# Patient Record
Sex: Female | Born: 1940 | Race: White | Hispanic: No | Marital: Married | Smoking: Never smoker
Health system: Southern US, Community
[De-identification: ages and names within clinical notes are randomized; demographics above are authoritative.]

---

## 2019-02-10 ENCOUNTER — Emergency Department (HOSPITAL_COMMUNITY): Payer: Medicare Other

## 2019-02-10 ENCOUNTER — Encounter (HOSPITAL_COMMUNITY): Payer: Self-pay | Admitting: Emergency Medicine

## 2019-02-10 ENCOUNTER — Emergency Department (HOSPITAL_COMMUNITY)
Admission: EM | Admit: 2019-02-10 | Discharge: 2019-02-11 | Disposition: A | Discharge status: Home / Self Care | Payer: Medicare Other | Attending: Emergency Medicine | Admitting: Emergency Medicine

## 2019-02-10 DIAGNOSIS — Y939 Activity, unspecified: Secondary | ICD-10-CM | POA: Diagnosis not present

## 2019-02-10 DIAGNOSIS — Y999 Unspecified external cause status: Secondary | ICD-10-CM | POA: Insufficient documentation

## 2019-02-10 DIAGNOSIS — F0390 Unspecified dementia without behavioral disturbance: Secondary | ICD-10-CM | POA: Diagnosis not present

## 2019-02-10 DIAGNOSIS — W19XXXA Unspecified fall, initial encounter: Secondary | ICD-10-CM | POA: Diagnosis not present

## 2019-02-10 DIAGNOSIS — Z79899 Other long term (current) drug therapy: Secondary | ICD-10-CM | POA: Insufficient documentation

## 2019-02-10 DIAGNOSIS — Y92129 Unspecified place in nursing home as the place of occurrence of the external cause: Secondary | ICD-10-CM | POA: Insufficient documentation

## 2019-02-10 DIAGNOSIS — S0990XA Unspecified injury of head, initial encounter: Secondary | ICD-10-CM | POA: Diagnosis present

## 2019-02-10 DIAGNOSIS — S0003XA Contusion of scalp, initial encounter: Secondary | ICD-10-CM | POA: Insufficient documentation

## 2019-02-10 NOTE — ED Triage Notes (Signed)
Patient is from [facility] and transported via [facility]. Patient stood up and fell backwards. A staff member was near the patient but didn;t the patient fall. Patient has a posterior hematoma. According to EMS, patient has had no LOC and not taking any blood thinner. EMS applied a c-collar to patient.

## 2019-02-10 NOTE — ED Provider Notes (Signed)
[facility] DEPT Provider Note   CSN: [id number] Arrival date & time: 02/10/19  2036    History   Chief Complaint Chief Complaint  Patient presents with  . Fall    HPI Claire Hoffman is a 78 y.o. female.     78 yo F with a chief complaints of a fall.  This happened at her facility.  They noted that she had a hematoma to the back of the head and sent her here.  At baseline per the nursing facility.  History of dementia.  Level 5 caveat dementia.  The history is provided by the patient, the EMS personnel and the nursing home.  Fall Pertinent negatives include no chest pain, no headaches and no shortness of breath.  Illness Severity:  Mild Onset quality:  Sudden Duration:  2 hours Timing:  Constant Progression:  Unchanged Chronicity:  New Associated symptoms: no chest pain, no congestion, no fever, no headaches, no myalgias, no nausea, no rhinorrhea, no shortness of breath, no vomiting and no wheezing     History reviewed. No pertinent past medical history.  There are no active problems to display for this patient.   History reviewed. No pertinent surgical history.   OB History   No obstetric history on file.      Home Medications    Prior to Admission medications   Medication Sig Start Date End Date Taking? Authorizing Provider  amLODipine (NORVASC) 5 MG tablet Take 5 mg by mouth daily.   Yes [provider]  aspirin 81 MG chewable tablet Chew 81 mg by mouth daily.   Yes [provider]  atorvastatin (LIPITOR) 10 MG tablet Take 10 mg by mouth daily.   Yes [provider]  divalproex (DEPAKOTE) 125 MG DR tablet Take 125 mg by mouth 2 (two) times daily.   Yes [provider]  levothyroxine (SYNTHROID) 75 MCG tablet Take 75 mcg by mouth once.   Yes [provider]  lisinopril (ZESTRIL) 40 MG tablet Take 40 mg by mouth daily.   Yes [provider]  OLANZapine (ZYPREXA) 5 MG  tablet Take 5 mg by mouth at bedtime.   Yes [provider]    Family History No family history on file.  Social History Social History   Tobacco Use  . Smoking status: Not on file  Substance Use Topics  . Alcohol use: Not on file  . Drug use: Not on file     Allergies   Patient has no known allergies.   Review of Systems Review of Systems  Unable to perform ROS: Dementia  Constitutional: Negative for chills and fever.  HENT: Negative for congestion and rhinorrhea.   Eyes: Negative for redness and visual disturbance.  Respiratory: Negative for shortness of breath and wheezing.   Cardiovascular: Negative for chest pain and palpitations.  Gastrointestinal: Negative for nausea and vomiting.  Genitourinary: Negative for dysuria and urgency.  Musculoskeletal: Negative for arthralgias and myalgias.  Skin: Negative for pallor and wound.  Neurological: Negative for dizziness and headaches.     Physical Exam Updated Vital Signs BP (!) 155/81 (BP Location: Left Arm)   Pulse 90   Temp 98.5 F (36.9 C) (Oral)   Resp 15   SpO2 100%   Physical Exam Vitals signs and nursing note reviewed.  Constitutional:      General: She is not in acute distress.    Appearance: She is well-developed. She is not diaphoretic.     Comments: Moans to  stimulation  HENT:     Head: Normocephalic and atraumatic.  Eyes:     Pupils: Pupils are equal, round, and reactive to light.  Neck:     Musculoskeletal: Normal range of motion and neck supple.  Cardiovascular:     Rate and Rhythm: Normal rate and regular rhythm.     Heart sounds: No murmur. No friction rub. No gallop.   Pulmonary:     Effort: Pulmonary effort is normal.     Breath sounds: No wheezing or rales.  Abdominal:     General: There is no distension.     Palpations: Abdomen is soft.     Tenderness: There is no abdominal tenderness.  Musculoskeletal:        General: No tenderness.  Skin:    General: Skin is warm and  dry.      ED Treatments / Results  Labs (all labs ordered are listed, but only abnormal results are displayed) Labs Reviewed - No data to display  EKG None  Radiology No results found.  Procedures Procedures (including critical care time)  Medications Ordered in ED Medications - No data to display   Initial Impression / Assessment and Plan / ED Course  I have reviewed the triage vital signs and the nursing notes.  Pertinent labs & imaging results that were available during my care of the patient were reviewed by me and considered in my medical decision making (see chart for details).        78 yo F with a chief complaints of a fall.  At her baseline per the nursing home.  Minimally interactive with me if at all.  Arms and legs seem partially contractured.  Will obtain a CT of the head and CT C-spine.  Patient CARE signed out to Dr. Blinda LeatherwoodPollina please see his note for further details care in the ED.  The patients results and plan were reviewed and discussed.   Any x-rays performed were independently reviewed by myself.   Differential diagnosis were considered with the presenting HPI.  Medications - No data to display  Vitals:   02/10/19 2051 02/10/19 2052  BP:  (!) 155/81  Pulse:  90  Resp:  15  Temp:  98.5 F (36.9 C)  TempSrc:  Oral  SpO2: 95% 100%    Final diagnoses:  Injury of head, initial encounter      Final Clinical Impressions(s) / ED Diagnoses   Final diagnoses:  Injury of head, initial encounter    ED Discharge Orders    None       Melene PlanFloyd, Alese Furniss, DO 02/10/19 2349

## 2019-02-11 ENCOUNTER — Emergency Department (HOSPITAL_COMMUNITY): Payer: Medicare Other

## 2019-02-11 DIAGNOSIS — S0003XA Contusion of scalp, initial encounter: Secondary | ICD-10-CM | POA: Diagnosis not present

## 2019-02-11 LAB — CBC WITH DIFFERENTIAL/PLATELET
Abs Immature Granulocytes: 0.05 10*3/uL (ref 0.00–0.07)
Basophils Absolute: 0.1 10*3/uL (ref 0.0–0.1)
Basophils Relative: 1 %
Eosinophils Absolute: 0.3 10*3/uL (ref 0.0–0.5)
Eosinophils Relative: 6 %
HCT: 31.9 % — ABNORMAL LOW (ref 36.0–46.0)
Hemoglobin: 10.1 g/dL — ABNORMAL LOW (ref 12.0–15.0)
Immature Granulocytes: 1 %
Lymphocytes Relative: 15 %
Lymphs Abs: 0.9 10*3/uL (ref 0.7–4.0)
MCH: 30.6 pg (ref 26.0–34.0)
MCHC: 31.7 g/dL (ref 30.0–36.0)
MCV: 96.7 fL (ref 80.0–100.0)
Monocytes Absolute: 0.7 10*3/uL (ref 0.1–1.0)
Monocytes Relative: 12 %
Neutro Abs: 3.7 10*3/uL (ref 1.7–7.7)
Neutrophils Relative %: 65 %
Platelets: 123 10*3/uL — ABNORMAL LOW (ref 150–400)
RBC: 3.3 MIL/uL — ABNORMAL LOW (ref 3.87–5.11)
RDW: 14 % (ref 11.5–15.5)
WBC: 5.7 10*3/uL (ref 4.0–10.5)
nRBC: 0 % (ref 0.0–0.2)

## 2019-02-11 LAB — URINALYSIS, ROUTINE W REFLEX MICROSCOPIC
Bilirubin Urine: NEGATIVE
Glucose, UA: NEGATIVE mg/dL
Hgb urine dipstick: NEGATIVE
Ketones, ur: NEGATIVE mg/dL
Leukocytes,Ua: NEGATIVE
Nitrite: NEGATIVE
Protein, ur: NEGATIVE mg/dL
Specific Gravity, Urine: 1.009 (ref 1.005–1.030)
pH: 8 (ref 5.0–8.0)

## 2019-02-11 LAB — BASIC METABOLIC PANEL
Anion gap: 8 (ref 5–15)
BUN: 10 mg/dL (ref 8–23)
CO2: 23 mmol/L (ref 22–32)
Calcium: 8.4 mg/dL — ABNORMAL LOW (ref 8.9–10.3)
Chloride: 111 mmol/L (ref 98–111)
Creatinine, Ser: 0.49 mg/dL (ref 0.44–1.00)
GFR calc Af Amer: >60 mL/min (ref >60)
GFR calc non Af Amer: >60 mL/min (ref >60)
Glucose, Bld: 89 mg/dL (ref 70–99)
Potassium: 3.3 mmol/L — ABNORMAL LOW (ref 3.5–5.1)
Sodium: 142 mmol/L (ref 135–145)

## 2019-02-11 NOTE — ED Notes (Signed)
Called PT and they will put this person on their schedule to be seen marked stat for discharge, per charge nurse, Hanley Seamen.

## 2019-02-11 NOTE — ED Provider Notes (Signed)
Patient was signed out to me by Dr. Tyrone Nine.  He had seen the patient after a fall.  Patient with contusion on posterior scalp.  CT head and cervical spine performed, no acute intracranial pathology, no cervical spine pathology noted.  Further examination did not reveal any suspicion for additional injury.  Patient has not, however, able to stand up.  She is too weak to stand on her own.  She apparently just moved into the assisted living within the last 24 hours.  Staff there were during their initial evaluation from her when she fell.  It does not seem that the patient will be able to go to assisted living at this time, will have social work evaluate the patient for further help.   Orpah Greek, MD 02/11/19 [phone]

## 2019-02-11 NOTE — ED Notes (Signed)
Patient arrived from main ED alert but fell back asleep when moved into bed.  Social work to evaluate patient for placement.

## 2019-02-11 NOTE — Progress Notes (Addendum)
2:08p  CSW spoke with Crystal at [facility] and went over the PT eval recommendations with her. Crystal reports staff will be able to supervise patient as she ambulates. Patient is able to return to their facility. Number for report is [phone].   11:26a CSW received call from [facility]. She reports patient has been with them since 7/7. Crystal reports patient hasa  Hx of dementia and from her assessment patient is only oriented to person. Crystal reports since at the facility patient has not been able to follow commands and someone has to assist her in keeping her focus. Crystal reports patient was walking fine while at [facility], but also says she does tend to trip on things and does not have much "safety awareness". CSW informed Crystal has not been able to ambulate since being at the hospital and a PT consult was ordered. Crystal reports [facility] will take her back once she is cleared. CSW also asked able getting in touch with patient's husband and per Crystal, patient's husband is out of town and she is not aware of a cell phone number for him. CSW will continue to follow up.   8:13a CSW called patient's spouse Edd Arbour and [place] to get collateral information and to see if patient was accepted into [facility] or not. CSW did not receive an answer from either. CSW left VM with spouse, unable to leave message with [facility].  [place]en Circle, LCSW Transitions of Care Department [facility] ED [phone]

## 2019-02-11 NOTE — ED Notes (Signed)
Could not safely ambulate patient, even with 2 person assist

## 2019-02-11 NOTE — ED Notes (Signed)
Urine and culture sent to lab  

## 2019-02-11 NOTE — ED Notes (Signed)
XR at bedside

## 2019-02-11 NOTE — ED Notes (Signed)
Patient alert but unable to feed self.  Sitter at bedside feeding patient.

## 2019-02-11 NOTE — ED Notes (Signed)
Attempted x 2 to call report to [facility].  No answer.  [facility] called for transport.

## 2019-02-11 NOTE — ED Notes (Signed)
Patient woke up.  PT consult done.  Patient is slow to respond and appears to be sleepy.  Sitting on edge of bed for lunch.  Patient picked up a napkin put it over her food and poured milk on the napkin.  Now she is sleeping again.

## 2019-02-11 NOTE — Evaluation (Addendum)
Physical Therapy Evaluation Patient Details Name: Claire Hoffman MRN: [id number] DOB: [birth year] Today's Date: 02/11/2019   History of Present Illness  78 y.o. female admitted with a fall and contusion to posterior scalp. Pt admitted from ALF, she had been there less than 24*.  PMH of dementia.  Clinical Impression   Pt was sleeping soundly upon my arrival, she did not arouse to verbal nor tactile stimulii. When assisted to sitting on edge of bed, she opened her eyes and became alert. Pt was unable to state her name nor her birth date. Pt ambulated 200' with supervision, no loss of balance. Pt does not follow commands so high level balance assessment could not be completed. With her degree of confusion, it's unlikely she would be receptive to use of an assistive device. Supervision for mobility recommended due to recent fall. She is safe to return to ALF if they are able to provide close supervision for mobility. If not, then SNF recommended. Pt was observed pouring milk on top of a napkin that she'd placed over her grits, she will also need assistance for meals.     Follow Up Recommendations No PT follow up;Supervision for mobility/OOB    Equipment Recommendations  None recommended by PT    Recommendations for Other Services       Precautions / Restrictions Precautions Precautions: Fall Precaution Comments: fall at ALF on day of admission Restrictions Weight Bearing Restrictions: No      Mobility  Bed Mobility Overal bed mobility: Needs Assistance Bed Mobility: Supine to Sit     Supine to sit: Supervision     General bed mobility comments: supervision for safety  Transfers Overall transfer level: Needs assistance Equipment used: None Transfers: Sit to/from Stand Sit to Stand: Min guard;Supervision         General transfer comment: close min/guard assist for safety due to recent fall, no loss of balance  Ambulation/Gait Ambulation/Gait assistance:  Supervision Gait Distance (Feet): 200 Feet Assistive device: None Gait Pattern/deviations: Step-through pattern;Decreased stride length Gait velocity: WFL   General Gait Details: steady, no loss of balance with turns  Science writer    Modified Rankin (Stroke Patients Only)       Balance Overall balance assessment: History of Falls(no loss of balance during PT evaluation)                                           Pertinent Vitals/Pain Pain Assessment: Faces Faces Pain Scale: No hurt    Home Living Family/patient expects to be discharged to:: Assisted living                      Prior Function Level of Independence: Independent               Hand Dominance        Extremity/Trunk Assessment   Upper Extremity Assessment Upper Extremity Assessment: Overall WFL for tasks assessed;Difficult to assess due to impaired cognition    Lower Extremity Assessment Lower Extremity Assessment: Overall WFL for tasks assessed;Difficult to assess due to impaired cognition(appears WFL for ambulation, pt unable to follow commands for manual muscle testing 2* dementia)       Communication   Communication: Expressive difficulties(mumbles)  Cognition Arousal/Alertness: Lethargic;Awake/alert(sleeping upon my arrival, did not arouse to verbal/tactile stimulii, but when  assisted to sitting position pt opened eyes and became alert)   Overall Cognitive Status: No family/caregiver present to determine baseline cognitive functioning                                 General Comments: pt oriented x 0, cannot state her name nor birthdate, speech is garbled and difficult to understand, she was not able to follow simple 1 step commands      General Comments      Exercises     Assessment/Plan    PT Assessment Patent does not need any further PT services  PT Problem List         PT Treatment Interventions       PT Goals (Current goals can be found in the Care Plan section)  Acute Rehab PT Goals PT Goal Formulation: All assessment and education complete, DC therapy    Frequency     Barriers to discharge        Co-evaluation               AM-PAC PT "6 Clicks" Mobility  Outcome Measure Help needed turning from your back to your side while in a flat bed without using bedrails?: None Help needed moving from lying on your back to sitting on the side of a flat bed without using bedrails?: None Help needed moving to and from a bed to a chair (including a wheelchair)?: A Little Help needed standing up from a chair using your arms (e.g., wheelchair or bedside chair)?: None Help needed to walk in hospital room?: A Little Help needed climbing 3-5 steps with a railing? : A Little 6 Click Score: 21    End of Session Equipment Utilized During Treatment: Gait belt Activity Tolerance: Patient tolerated treatment well Patient left: in bed;with call bell/phone within reach;with bed alarm set Nurse Communication: Mobility status      Time: 9562-13081145-1201 PT Time Calculation (min) (ACUTE ONLY): 16 min   Charges:   PT Evaluation $PT Eval Low Complexity: 1 Low         Tamala SerUhlenberg, Linkon Siverson Kistler PT 02/11/2019  Acute Rehabilitation Services Pager [phone] Office [phone]

## 2019-02-12 ENCOUNTER — Telehealth: Payer: Self-pay | Admitting: Internal Medicine

## 2019-02-12 NOTE — Telephone Encounter (Deleted)
CSW received phone call from patient's husband Edd Arbour, [phone], he was returning call back from weekday CSW.  CSW informed patient's husband that patient discharged yesterday.  Patient's husband stated he was out of town, and was appreciative of information given.  Jones Broom. Nadine Ryle, MSW, [place] ED covering [place] [phone] 02/12/2019 11:46 AM

## 2019-02-12 NOTE — Telephone Encounter (Signed)
CSW received phone call from patient's husband Edd Arbour, [phone], he was returning call back from weekday CSW.  CSW informed patient's husband that patient discharged yesterday.  Patient's husband stated he was out of town, and was appreciative of information given.  Jones Broom. Naydene Kamrowski, MSW, [place] ED covering [place] [phone] 02/12/2019 11:50 AM

## 2019-02-12 NOTE — Social Work (Signed)
CSW received phone call from patient's husband Claire Hoffman, [phone], he was returning call back from weekday CSW.  CSW informed patient's husband that patient discharged yesterday.  Patient's husband stated he was out of town, and was appreciative of information given.  Claire Hoffman R. Holland Nickson, MSW, LCSW [place] ED covering CSW [phone] 02/12/2019 11:50 AM  

## 2019-03-05 ENCOUNTER — Emergency Department (HOSPITAL_COMMUNITY): Payer: Medicare Other

## 2019-03-05 ENCOUNTER — Inpatient Hospital Stay (HOSPITAL_COMMUNITY)
Admission: EM | Admit: 2019-03-05 | Discharge: 2019-03-14 | DRG: 312 | Disposition: A | Discharge status: Skilled Nursing Facility | Payer: Medicare Other | Source: Skilled Nursing Facility | Attending: Internal Medicine | Admitting: Internal Medicine

## 2019-03-05 ENCOUNTER — Other Ambulatory Visit: Payer: Self-pay

## 2019-03-05 ENCOUNTER — Encounter (HOSPITAL_COMMUNITY): Payer: Self-pay

## 2019-03-05 DIAGNOSIS — Z79890 Hormone replacement therapy (postmenopausal): Secondary | ICD-10-CM

## 2019-03-05 DIAGNOSIS — Y92129 Unspecified place in nursing home as the place of occurrence of the external cause: Secondary | ICD-10-CM

## 2019-03-05 DIAGNOSIS — Z20828 Contact with and (suspected) exposure to other viral communicable diseases: Secondary | ICD-10-CM | POA: Diagnosis present

## 2019-03-05 DIAGNOSIS — F0390 Unspecified dementia without behavioral disturbance: Secondary | ICD-10-CM

## 2019-03-05 DIAGNOSIS — R58 Hemorrhage, not elsewhere classified: Secondary | ICD-10-CM | POA: Diagnosis not present

## 2019-03-05 DIAGNOSIS — E785 Hyperlipidemia, unspecified: Secondary | ICD-10-CM

## 2019-03-05 DIAGNOSIS — F0391 Unspecified dementia with behavioral disturbance: Secondary | ICD-10-CM | POA: Diagnosis present

## 2019-03-05 DIAGNOSIS — S51012A Laceration without foreign body of left elbow, initial encounter: Secondary | ICD-10-CM | POA: Diagnosis present

## 2019-03-05 DIAGNOSIS — E860 Dehydration: Secondary | ICD-10-CM | POA: Diagnosis present

## 2019-03-05 DIAGNOSIS — Z7982 Long term (current) use of aspirin: Secondary | ICD-10-CM

## 2019-03-05 DIAGNOSIS — D649 Anemia, unspecified: Secondary | ICD-10-CM

## 2019-03-05 DIAGNOSIS — I10 Essential (primary) hypertension: Secondary | ICD-10-CM | POA: Diagnosis present

## 2019-03-05 DIAGNOSIS — G40909 Epilepsy, unspecified, not intractable, without status epilepticus: Secondary | ICD-10-CM | POA: Diagnosis present

## 2019-03-05 DIAGNOSIS — I951 Orthostatic hypotension: Principal | ICD-10-CM | POA: Diagnosis present

## 2019-03-05 DIAGNOSIS — E039 Hypothyroidism, unspecified: Secondary | ICD-10-CM | POA: Diagnosis present

## 2019-03-05 DIAGNOSIS — D509 Iron deficiency anemia, unspecified: Secondary | ICD-10-CM | POA: Diagnosis present

## 2019-03-05 DIAGNOSIS — W19XXXA Unspecified fall, initial encounter: Secondary | ICD-10-CM | POA: Diagnosis present

## 2019-03-05 LAB — CBC WITH DIFFERENTIAL/PLATELET
Abs Immature Granulocytes: 0.04 10*3/uL (ref 0.00–0.07)
Basophils Absolute: 0.1 10*3/uL (ref 0.0–0.1)
Basophils Relative: 1 %
Eosinophils Absolute: 0.1 10*3/uL (ref 0.0–0.5)
Eosinophils Relative: 1 %
HCT: 25.9 % — ABNORMAL LOW (ref 36.0–46.0)
Hemoglobin: 8.1 g/dL — ABNORMAL LOW (ref 12.0–15.0)
Immature Granulocytes: 1 %
Lymphocytes Relative: 13 %
Lymphs Abs: 1.1 10*3/uL (ref 0.7–4.0)
MCH: 31.3 pg (ref 26.0–34.0)
MCHC: 31.3 g/dL (ref 30.0–36.0)
MCV: 100 fL (ref 80.0–100.0)
Monocytes Absolute: 0.9 10*3/uL (ref 0.1–1.0)
Monocytes Relative: 11 %
Neutro Abs: 5.9 10*3/uL (ref 1.7–7.7)
Neutrophils Relative %: 73 %
Platelets: 249 10*3/uL (ref 150–400)
RBC: 2.59 MIL/uL — ABNORMAL LOW (ref 3.87–5.11)
RDW: 14.7 % (ref 11.5–15.5)
WBC: 8 10*3/uL (ref 4.0–10.5)
nRBC: 0 % (ref 0.0–0.2)

## 2019-03-05 LAB — URINALYSIS, ROUTINE W REFLEX MICROSCOPIC
Bilirubin Urine: NEGATIVE
Glucose, UA: NEGATIVE mg/dL
Hgb urine dipstick: NEGATIVE
Ketones, ur: 5 mg/dL — AB
Leukocytes,Ua: NEGATIVE
Nitrite: NEGATIVE
Protein, ur: NEGATIVE mg/dL
Specific Gravity, Urine: 1.015 (ref 1.005–1.030)
pH: 7 (ref 5.0–8.0)

## 2019-03-05 LAB — LACTIC ACID, PLASMA: Lactic Acid, Venous: 1.2 mmol/L (ref 0.5–1.9)

## 2019-03-05 LAB — BASIC METABOLIC PANEL
Anion gap: 8 (ref 5–15)
BUN: 19 mg/dL (ref 8–23)
CO2: 26 mmol/L (ref 22–32)
Calcium: 8.6 mg/dL — ABNORMAL LOW (ref 8.9–10.3)
Chloride: 105 mmol/L (ref 98–111)
Creatinine, Ser: 0.6 mg/dL (ref 0.44–1.00)
GFR calc Af Amer: >60 mL/min (ref >60)
GFR calc non Af Amer: >60 mL/min (ref >60)
Glucose, Bld: 104 mg/dL — ABNORMAL HIGH (ref 70–99)
Potassium: 4.2 mmol/L (ref 3.5–5.1)
Sodium: 139 mmol/L (ref 135–145)

## 2019-03-05 LAB — RETICULOCYTES
Immature Retic Fract: 18 % — ABNORMAL HIGH (ref 2.3–15.9)
RBC.: 2.62 MIL/uL — ABNORMAL LOW (ref 3.87–5.11)
Retic Count, Absolute: 122.9 10*3/uL (ref 19.0–186.0)
Retic Ct Pct: 4.7 % — ABNORMAL HIGH (ref 0.4–3.1)

## 2019-03-05 LAB — APTT: aPTT: 35 seconds (ref 24–36)

## 2019-03-05 LAB — POC OCCULT BLOOD, ED: Fecal Occult Bld: NEGATIVE

## 2019-03-05 LAB — PROTIME-INR
INR: 1 (ref 0.8–1.2)
Prothrombin Time: 13.3 seconds (ref 11.4–15.2)

## 2019-03-05 NOTE — ED Notes (Signed)
Patient transported to X-ray 

## 2019-03-05 NOTE — ED Provider Notes (Signed)
[facility] DEPT Provider Note   CSN: [id number] Arrival date & time: 03/05/19  1746    History   Chief Complaint Chief Complaint  Patient presents with   Fall    HPI Chrystie Hagwood is a 78 y.o. female with a past medical history of dementia, nonverbal at baseline, who presents today for evaluation after an unwitnessed fall.  History obtained from triage note and staff it [facility].  They report that patient had an unwitnessed fall.    Report patient had a skin tear on her left elbow.       HPI  History reviewed. No pertinent past medical history.  Patient Active Problem List   Diagnosis Date Noted   Anemia 03/06/2019    History reviewed. No pertinent surgical history.   OB History   No obstetric history on file.      Home Medications    Prior to Admission medications   Medication Sig Start Date End Date Taking? Authorizing Provider  amLODipine (NORVASC) 5 MG tablet Take 5 mg by mouth daily.    [provider]  aspirin 81 MG chewable tablet Chew 81 mg by mouth daily.    [provider]  atorvastatin (LIPITOR) 10 MG tablet Take 10 mg by mouth daily.    [provider]  divalproex (DEPAKOTE) 125 MG DR tablet Take 125 mg by mouth 2 (two) times daily.    [provider]  levothyroxine (SYNTHROID) 75 MCG tablet Take 75 mcg by mouth once.    [provider]  lisinopril (ZESTRIL) 40 MG tablet Take 40 mg by mouth daily.    [provider]  OLANZapine (ZYPREXA) 5 MG tablet Take 5 mg by mouth at bedtime.    [provider]    Family History History reviewed. No pertinent family history.  Social History Social History   Tobacco Use   Smoking status: Never Smoker   Smokeless tobacco: Never Used  Substance Use Topics   Alcohol use: Not on file   Drug use: Not on file     Allergies   Patient has no known allergies.   Review of Systems Review of Systems    Unable to perform ROS: Dementia     Physical Exam Updated Vital Signs BP 118/82    Pulse 90    Temp (!) 97.2 F (36.2 C) (Rectal)    Resp 13    SpO2 100%   Physical Exam Vitals signs and nursing note reviewed.  Constitutional:      General: She is not in acute distress.    Appearance: She is well-developed. She is not diaphoretic.     Comments: C-collar in place, mumbles  HENT:     Head: Normocephalic.     Ears:     Comments: Left ear has swelling which appears old with ecchymosis Eyes:     General: No scleral icterus.       Right eye: No discharge.        Left eye: No discharge.     Conjunctiva/sclera: Conjunctivae normal.  Neck:     Musculoskeletal: Normal range of motion.  Cardiovascular:     Rate and Rhythm: Normal rate and regular rhythm.     Pulses: Normal pulses.     Heart sounds: Normal heart sounds.  Pulmonary:     Effort: Pulmonary effort is normal. No respiratory distress.     Breath sounds: Normal breath sounds. No stridor.  Abdominal:     General: Abdomen is  flat. There is no distension.  Musculoskeletal:        General: No deformity.  Skin:    General: Skin is warm and dry.     Comments: There is extensive ecchymosis over the left leg, please see clinical image.  There is also ecchymosis on the left sided buttock/sacrum.  There is a area of ecchymosis over the right anterior neck and multiple areas of ecchymosis on bilateral arms.  There is a 3 cm skin tear on the left elbow.  Neurological:     Mental Status: She is alert.     Motor: No abnormal muscle tone.     Comments: Awake and alert, moves all extremities spontaneously.  She frequently mumbles nonsensical word combinations.    Psychiatric:        Mood and Affect: Mood normal.        Behavior: Behavior normal.    Right anterior neck              ED Treatments / Results  Labs (all labs ordered are listed, but only abnormal results are displayed) Labs Reviewed  URINALYSIS, ROUTINE W  REFLEX MICROSCOPIC - Abnormal; Notable for the following components:      Result Value   Ketones, ur 5 (*)    All other components within normal limits  CBC WITH DIFFERENTIAL/PLATELET - Abnormal; Notable for the following components:   RBC 2.59 (*)    Hemoglobin 8.1 (*)    HCT 25.9 (*)    All other components within normal limits  BASIC METABOLIC PANEL - Abnormal; Notable for the following components:   Glucose, Bld 104 (*)    Calcium 8.6 (*)    All other components within normal limits  RETICULOCYTES - Abnormal; Notable for the following components:   Retic Ct Pct 4.7 (*)    RBC. 2.62 (*)    Immature Retic Fract 18.0 (*)    All other components within normal limits  URINE CULTURE  SARS CORONAVIRUS 2  LACTIC ACID, PLASMA  PROTIME-INR  APTT  VITAMIN B12  FOLATE  IRON AND TIBC  FERRITIN  LACTIC ACID, PLASMA  CBC  POC OCCULT BLOOD, ED    EKG EKG Interpretation  Date/Time:  Saturday March 05 2019 19:23:54 EDT Ventricular Rate:  98 PR Interval:    QRS Duration: 152 QT Interval:  338 QTC Calculation: 430 R Axis:   -41 Text Interpretation:  Sinus rhythm Nonspecific IVCD with LAD Probable anteroseptal infarct, old Artifact in lead(s) I II III aVR aVL aVF V1 V2 V3 V4 V5 V6 No old tracing to compare Confirmed by Rolan BuccoBelfi, Melanie [phone]([id number]) on 03/05/2019 9:33:02 PM   Radiology Dg Tibia/fibula Left  Result Date: 03/05/2019 CLINICAL DATA:  Fall, bruising EXAM: LEFT TIBIA AND FIBULA - 2 VIEW COMPARISON:  None. FINDINGS: There is no evidence of fracture or other focal bone lesions. Soft tissues are unremarkable. IMPRESSION: Negative. Electronically Signed   By: Charlett NoseKevin  Dover M.D.   On: 03/05/2019 20:52   Dg Ankle Complete Left  Result Date: 03/05/2019 CLINICAL DATA:  Fall EXAM: LEFT ANKLE COMPLETE - 3+ VIEW COMPARISON:  None. FINDINGS: There is no evidence of fracture, dislocation, or joint effusion. There is no evidence of arthropathy or other focal bone abnormality. Soft tissues are  unremarkable. IMPRESSION: Negative. Electronically Signed   By: Charlett NoseKevin  Dover M.D.   On: 03/05/2019 20:51   Ct Head Wo Contrast  Result Date: 03/05/2019 CLINICAL DATA:  Unwitnessed fall with hematoma on back of head. EXAM: CT  HEAD WITHOUT CONTRAST CT CERVICAL SPINE WITHOUT CONTRAST TECHNIQUE: Multidetector CT imaging of the head and cervical spine was performed following the standard protocol without intravenous contrast. Multiplanar CT image reconstructions of the cervical spine were also generated. COMPARISON:  02/10/2019 FINDINGS: CT HEAD FINDINGS Brain: No evidence of acute infarction, hemorrhage, hydrocephalus, extra-axial collection or mass lesion/mass effect. There is mild diffuse low-attenuation within the subcortical and periventricular white matter compatible with chronic microvascular disease. Prominence of sulci and ventricles compatible with brain atrophy. Vascular: No hyperdense vessel or unexpected calcification. Skull: Normal. Negative for fracture or focal lesion. Sinuses/Orbits: No acute finding. Other: None CT CERVICAL SPINE FINDINGS Alignment: Normal. Skull base and vertebrae: No acute fracture. No primary bone lesion or focal pathologic process. Soft tissues and spinal canal: No prevertebral fluid or swelling. No visible canal hematoma. Disc levels: There is marked disc space narrowing and endplate spurring identified at C4-5, C5-6 and C6-7. Bilateral facet hypertrophy and degenerative change noted. Upper chest: Negative. Other: None IMPRESSION: 1. No acute intracranial abnormality. Chronic small vessel ischemic disease and brain atrophy noted. 2. No acute cervical spine findings. Multi level cervical degenerative disc disease. Electronically Signed   By: Signa Kellaylor  Stroud M.D.   On: 03/05/2019 19:54   Ct Cervical Spine Wo Contrast  Result Date: 03/05/2019 CLINICAL DATA:  Unwitnessed fall with hematoma on back of head. EXAM: CT HEAD WITHOUT CONTRAST CT CERVICAL SPINE WITHOUT CONTRAST  TECHNIQUE: Multidetector CT imaging of the head and cervical spine was performed following the standard protocol without intravenous contrast. Multiplanar CT image reconstructions of the cervical spine were also generated. COMPARISON:  02/10/2019 FINDINGS: CT HEAD FINDINGS Brain: No evidence of acute infarction, hemorrhage, hydrocephalus, extra-axial collection or mass lesion/mass effect. There is mild diffuse low-attenuation within the subcortical and periventricular white matter compatible with chronic microvascular disease. Prominence of sulci and ventricles compatible with brain atrophy. Vascular: No hyperdense vessel or unexpected calcification. Skull: Normal. Negative for fracture or focal lesion. Sinuses/Orbits: No acute finding. Other: None CT CERVICAL SPINE FINDINGS Alignment: Normal. Skull base and vertebrae: No acute fracture. No primary bone lesion or focal pathologic process. Soft tissues and spinal canal: No prevertebral fluid or swelling. No visible canal hematoma. Disc levels: There is marked disc space narrowing and endplate spurring identified at C4-5, C5-6 and C6-7. Bilateral facet hypertrophy and degenerative change noted. Upper chest: Negative. Other: None IMPRESSION: 1. No acute intracranial abnormality. Chronic small vessel ischemic disease and brain atrophy noted. 2. No acute cervical spine findings. Multi level cervical degenerative disc disease. Electronically Signed   By: Signa Kellaylor  Stroud M.D.   On: 03/05/2019 19:54   Dg Chest Port 1 View  Result Date: 03/05/2019 CLINICAL DATA:  Syncope.  Dementia. EXAM: PORTABLE CHEST 1 VIEW COMPARISON:  February 11, 2019 FINDINGS: There is no edema or consolidation. Heart size and pulmonary vascularity are normal. No adenopathy. There is aortic atherosclerosis. No pneumothorax. No bone lesions. IMPRESSION: No edema or consolidation. Stable cardiac silhouette. Aortic Atherosclerosis (ICD10-I70.0). Electronically Signed   By: Bretta BangWilliam  Woodruff III M.D.   On:  03/05/2019 19:34   Dg Hip Unilat With Pelvis 2-3 Views Left  Result Date: 03/05/2019 CLINICAL DATA:  Un witnessed fall. EXAM: DG HIP (WITH OR WITHOUT PELVIS) 2-3V LEFT COMPARISON:  None FINDINGS: There is no evidence of acute fracture or dislocation. Moderate left hip osteoarthritis with joint space narrowing, subchondral sclerosis and marginal spur formation. Mild right hip osteoarthritis. IMPRESSION: 1. No acute fracture identified. 2. Moderate left hip osteoarthritis. Electronically Signed  By: Signa Kellaylor  Stroud M.D.   On: 03/05/2019 20:53    Procedures Procedures (including critical care time)  Medications Ordered in ED Medications     Initial Impression / Assessment and Plan / ED Course  I have reviewed the triage vital signs and the nursing notes.  Pertinent labs & imaging results that were available during my care of the patient were reviewed by me and considered in my medical decision making (see chart for details).  Clinical Course as of Mar 05 8  Sat Mar 05, 2019  1819 Aura Dialsonjia Rn- reports that she is acting at her baseline, no medical concerns recently,  Put her on depakote BID 250mg   recently.    [EH]  1823 I spoke with patient's spouse, Christen BameRonnie, who states that patient has been having issues with continence recently and he is concerned she may have a urinary tract infection.   [EH]  Sun Mar 06, 2019  0003 I spoke with hospitalist.  Given that her hemoglobin has dropped from in the 10 down to 8.1 in approximately 2 to 3 weeks I am concerned that she may be having syncopal events from the anemia given her frequent falls and extensive ecchymosis.     [EH]  0008 Spoke with patient's spouse to update him of plan to admit along with the anemia.   [EH]    Clinical Course User Index [EH] Cristina GongHammond, Azaria Stegman W, PA-C       Patient presents today for evaluation after a unwitnessed fall at her memory care facility, she was found on the ground.  On evaluation she has extensive  ecchymosis along her left leg, over her left-sided sacrum with additional ecchymosis on her bilateral arms, and the right anterior neck.  I discussed her care today with her spouse who wished for full medical evaluation and management.  Urine does not appear infected.  CBC shows anemia, her hemoglobin is 8.1 today and it was 10.1 on 02/11/2019.  Given this 2 g drop rectal exam was performed, Hemoccult was negative.  CT head and neck were obtained without evidence of acute abnormality.  Based on the extent of ecchymosis plain films of the left leg were obtained without evidence of fracture or cause for the ecchymosis.  Given her 2 g drop along with hemoglobin of 8.1 concerned that patient may have symptomatic anemia causing her frequent falls.  She is demented so this limits ability to evaluate her, however her husband states that she would want full medical care.  This patient was seen as a shared visit with Dr. Fredderick PhenixBelfi. I spoke with hospitalist who agrees to admit patient.  Patient remained hemodynamically stable while in my care.   Final Clinical Impressions(s) / ED Diagnoses   Final diagnoses:  Anemia, unspecified type  Fall, initial encounter  Ecchymosis    ED Discharge Orders    None       Norman ClayHammond, Stevey Stapleton W, PA-C 03/06/19 0013    Rolan BuccoBelfi, Melanie, MD 03/10/19 1110

## 2019-03-05 NOTE — ED Triage Notes (Signed)
EMS reports from [facility], unwitnessed fall. Small skin tear to left elbow. Dementia Pt, non verbal/communicative at baseline. No obvious injuries, does not appear to be in any distress.  BP 108/60 HR 88 RR 14 Sp02 98 RA CBG 98

## 2019-03-05 NOTE — ED Notes (Signed)
ED Provider at bedside. 

## 2019-03-05 NOTE — ED Notes (Signed)
Patient transported to CT 

## 2019-03-06 DIAGNOSIS — I10 Essential (primary) hypertension: Secondary | ICD-10-CM

## 2019-03-06 DIAGNOSIS — F0390 Unspecified dementia without behavioral disturbance: Secondary | ICD-10-CM

## 2019-03-06 DIAGNOSIS — D649 Anemia, unspecified: Secondary | ICD-10-CM | POA: Diagnosis present

## 2019-03-06 DIAGNOSIS — E785 Hyperlipidemia, unspecified: Secondary | ICD-10-CM

## 2019-03-06 LAB — IRON AND TIBC
Iron: 39 ug/dL (ref 28–170)
Saturation Ratios: 14 % (ref 10.4–31.8)
TIBC: 282 ug/dL (ref 250–450)
UIBC: 243 ug/dL

## 2019-03-06 LAB — CBC
HCT: 26.1 % — ABNORMAL LOW (ref 36.0–46.0)
Hemoglobin: 7.7 g/dL — ABNORMAL LOW (ref 12.0–15.0)
MCH: 30.2 pg (ref 26.0–34.0)
MCHC: 29.5 g/dL — ABNORMAL LOW (ref 30.0–36.0)
MCV: 102.4 fL — ABNORMAL HIGH (ref 80.0–100.0)
Platelets: 256 10*3/uL (ref 150–400)
RBC: 2.55 MIL/uL — ABNORMAL LOW (ref 3.87–5.11)
RDW: 14.5 % (ref 11.5–15.5)
WBC: 6.4 10*3/uL (ref 4.0–10.5)
nRBC: 0 % (ref 0.0–0.2)

## 2019-03-06 LAB — HEPATIC FUNCTION PANEL
ALT: 35 U/L (ref 0–44)
AST: 28 U/L (ref 15–41)
Albumin: 3.1 g/dL — ABNORMAL LOW (ref 3.5–5.0)
Alkaline Phosphatase: 65 U/L (ref 38–126)
Bilirubin, Direct: 0.2 mg/dL (ref 0.0–0.2)
Indirect Bilirubin: 1 mg/dL — ABNORMAL HIGH (ref 0.3–0.9)
Total Bilirubin: 1.2 mg/dL (ref 0.3–1.2)
Total Protein: 5.9 g/dL — ABNORMAL LOW (ref 6.5–8.1)

## 2019-03-06 LAB — FOLATE: Folate: 11.5 ng/mL (ref >5.9)

## 2019-03-06 LAB — TYPE AND SCREEN
ABO/RH(D): O POS
Antibody Screen: NEGATIVE

## 2019-03-06 LAB — SARS CORONAVIRUS 2 (TAT 6-24 HRS): SARS Coronavirus 2: NEGATIVE

## 2019-03-06 LAB — VITAMIN B12: Vitamin B-12: 476 pg/mL (ref 180–914)

## 2019-03-06 LAB — FERRITIN: Ferritin: 119 ng/mL (ref 11–307)

## 2019-03-06 MED ORDER — ASPIRIN 81 MG PO CHEW
81.0000 mg | CHEWABLE_TABLET | Freq: Every day | ORAL | Status: DC
  Administered 2019-03-06 – 2019-03-14 (×8): 81 mg via ORAL
  Filled 2019-03-06 (×8): qty 1

## 2019-03-06 MED ORDER — ONDANSETRON HCL 4 MG PO TABS: 4.0000 mg | ORAL_TABLET | Freq: Four times a day (QID) | ORAL | Status: DC | PRN

## 2019-03-06 MED ORDER — OLANZAPINE 5 MG PO TABS
5.0000 mg | ORAL_TABLET | Freq: Every day | ORAL | Status: DC
  Administered 2019-03-06 – 2019-03-07 (×2): 5 mg via ORAL
  Filled 2019-03-06 (×3): qty 1

## 2019-03-06 MED ORDER — DIVALPROEX SODIUM 125 MG PO DR TAB
125.0000 mg | DELAYED_RELEASE_TABLET | Freq: Two times a day (BID) | ORAL | Status: DC
  Administered 2019-03-06 – 2019-03-14 (×16): 125 mg via ORAL
  Filled 2019-03-06 (×20): qty 1

## 2019-03-06 MED ORDER — ENSURE ENLIVE PO LIQD
237.0000 mL | Freq: Two times a day (BID) | ORAL | Status: DC
  Administered 2019-03-07 – 2019-03-14 (×8): 237 mL via ORAL

## 2019-03-06 MED ORDER — LISINOPRIL 20 MG PO TABS
40.0000 mg | ORAL_TABLET | Freq: Every day | ORAL | Status: DC
  Filled 2019-03-06: qty 2

## 2019-03-06 MED ORDER — POLYETHYLENE GLYCOL 3350 17 G PO PACK: 17.0000 g | PACK | Freq: Every day | ORAL | Status: DC | PRN

## 2019-03-06 MED ORDER — ATORVASTATIN CALCIUM 10 MG PO TABS
10.0000 mg | ORAL_TABLET | Freq: Every day | ORAL | Status: DC
  Administered 2019-03-06 – 2019-03-14 (×8): 10 mg via ORAL
  Filled 2019-03-06 (×8): qty 1

## 2019-03-06 MED ORDER — ONDANSETRON HCL 4 MG/2ML IJ SOLN: 4.0000 mg | Freq: Four times a day (QID) | INTRAMUSCULAR | Status: DC | PRN

## 2019-03-06 MED ORDER — LEVOTHYROXINE SODIUM 75 MCG PO TABS
75.0000 ug | ORAL_TABLET | Freq: Once | ORAL | Status: AC
  Administered 2019-03-06: 06:00:00 75 ug via ORAL
  Filled 2019-03-06 (×2): qty 1

## 2019-03-06 MED ORDER — ACETAMINOPHEN 650 MG RE SUPP: 650.0000 mg | Freq: Four times a day (QID) | RECTAL | Status: DC | PRN

## 2019-03-06 MED ORDER — AMLODIPINE BESYLATE 5 MG PO TABS
5.0000 mg | ORAL_TABLET | Freq: Every day | ORAL | Status: DC
  Filled 2019-03-06: qty 1

## 2019-03-06 MED ORDER — LEVOTHYROXINE SODIUM 50 MCG PO TABS
75.0000 ug | ORAL_TABLET | Freq: Every day | ORAL | Status: DC
  Administered 2019-03-07 – 2019-03-14 (×8): 75 ug via ORAL
  Filled 2019-03-06 (×9): qty 1

## 2019-03-06 MED ORDER — ACETAMINOPHEN 325 MG PO TABS
650.0000 mg | ORAL_TABLET | Freq: Four times a day (QID) | ORAL | Status: DC | PRN
  Administered 2019-03-09 – 2019-03-11 (×2): 650 mg via ORAL
  Filled 2019-03-06 (×2): qty 2

## 2019-03-06 NOTE — Progress Notes (Signed)
  PROGRESS NOTE  Patient admitted earlier this morning. See H&P. Claire Hoffman is a 78 y.o. female with medical history significant for dementia and nonverbal currently residing in memory care unit, HTN, HLD admitted with worsening anemia. She does not have a known history of anemia, however in early July had Hb of 10. She was brought to ER tonight due to an unwitnessed fall, with history taken from ER provider.  Patient was admitted for further work-up of symptomatic anemia.  Patient appears comfortable on examination, largely nonverbal and unable to give any history or follow commands due to her dementia.  I attempted to call the husband, he did not answer the phone.  I left a voicemail for him to call us back.  No evidence of GI bleeding currently, FOBT negative.  Iron studies negative for iron deficiency anemia. CT head and cervical spine without acute abnormality X-ray of head negative for acute fracture X-ray left ankle negative for acute fracture X-ray left tibia-fibula negative for acute fracture UA negative for urinary tract infection, culture is pending Check orthostatic vital sign PT OT   Dessa Phi, DO [facility] [email or web address] 03/06/2019, 12:19 PM

## 2019-03-06 NOTE — Plan of Care (Signed)

## 2019-03-06 NOTE — ED Notes (Signed)
Attempted IV times 2 with no success. Charge RN to attempt at this time.

## 2019-03-06 NOTE — H&P (Signed)
History and Physical  Staci Dack BUL:[id number] DOB: 10-Jan-1941 DOA: 03/05/2019   PCP: Sande Brothers, MD   Patient coming from: [place]   Chief Complaint: unwitnessed fall   HPI: Claire Hoffman is a 78 y.o. female with medical history significant for Dementia, HTN, HLD admitted with worsening anemia. She does not have a known history of anemia, however in early July had Hb of 10. She was brought to ER tonight due to an unwitnessed fall, with history taken from ER provider as family is not present and patient is demented and not very verbal.  She was worked up with negative imaging but she is being admitted for observation due to a Hb of 8 tonight.  Review of Systems: A complete 10 system review of systems could not be done due to patient's dementia.  History reviewed. No pertinent past medical history. History reviewed. No pertinent surgical history.  Social History:  reports that she has never smoked. She has never used smokeless tobacco. No history on file for alcohol and drug.   No Known Allergies  History reviewed. No pertinent family history.   Prior to Admission medications   Medication Sig Start Date End Date Taking? Authorizing Provider  amLODipine (NORVASC) 5 MG tablet Take 5 mg by mouth daily.    [provider]  aspirin 81 MG chewable tablet Chew 81 mg by mouth daily.    [provider]  atorvastatin (LIPITOR) 10 MG tablet Take 10 mg by mouth daily.    [provider]  divalproex (DEPAKOTE) 125 MG DR tablet Take 125 mg by mouth 2 (two) times daily.    [provider]  levothyroxine (SYNTHROID) 75 MCG tablet Take 75 mcg by mouth once.    [provider]  lisinopril (ZESTRIL) 40 MG tablet Take 40 mg by mouth daily.    [provider]  OLANZapine (ZYPREXA) 5 MG tablet Take 5 mg by mouth at bedtime.    [provider]    Physical Exam: BP 118/82    Pulse 90    Temp (!) 97.2 F  (36.2 C) (Rectal)    Resp 13    SpO2 100%   General:  Alert, disoriented, calm, in no acute distress, looks pale Eyes: EOMI, clear conjuctivae, white sclerea Neck: supple, no masses, trachea mildline  Cardiovascular: RRR, no murmurs or rubs, no peripheral edema  Respiratory: clear to auscultation bilaterally, no wheezes, no crackles  Abdomen: soft, nontender, nondistended, normal bowel tones heard  Skin: dry, no rashes, she has no tenderness but diffuse partially resorbed eccymoses all along the left side of her body, including left foot, ankle, knee, thigh, hip, arm, shoulder Musculoskeletal: no joint effusions, normal range of motion  Psychiatric: appropriate affect, disoriented, minimal speech Neurologic: extraocular muscles intact, mumbling speech, moving all extremities with intact sensorium           Labs on Admission:  Basic Metabolic Panel: Recent Labs  Lab 03/05/19 2032  NA 139  K 4.2  CL 105  CO2 26  GLUCOSE 104*  BUN 19  CREATININE 0.60  CALCIUM 8.6*   Liver Function Tests: No results for input(s): AST, ALT, ALKPHOS, BILITOT, PROT, ALBUMIN in the last 168 hours. No results for input(s): LIPASE, AMYLASE in the last 168 hours. No results for input(s): AMMONIA in the last 168 hours. CBC: Recent Labs  Lab 03/05/19 2032  WBC 8.0  NEUTROABS 5.9  HGB 8.1*  HCT 25.9*  MCV 100.0  PLT 249  Cardiac Enzymes: No results for input(s): CKTOTAL, CKMB, CKMBINDEX, TROPONINI in the last 168 hours.  BNP (last 3 results) No results for input(s): BNP in the last 8760 hours.  ProBNP (last 3 results) No results for input(s): PROBNP in the last 8760 hours.  CBG: No results for input(s): GLUCAP in the last 168 hours.  Radiological Exams on Admission: Dg Tibia/fibula Left  Result Date: 03/05/2019 CLINICAL DATA:  Fall, bruising EXAM: LEFT TIBIA AND FIBULA - 2 VIEW COMPARISON:  None. FINDINGS: There is no evidence of fracture or other focal bone lesions. Soft tissues are  unremarkable. IMPRESSION: Negative. Electronically Signed   By: Charlett NoseKevin  Dover M.D.   On: 03/05/2019 20:52   Dg Ankle Complete Left  Result Date: 03/05/2019 CLINICAL DATA:  Fall EXAM: LEFT ANKLE COMPLETE - 3+ VIEW COMPARISON:  None. FINDINGS: There is no evidence of fracture, dislocation, or joint effusion. There is no evidence of arthropathy or other focal bone abnormality. Soft tissues are unremarkable. IMPRESSION: Negative. Electronically Signed   By: Charlett NoseKevin  Dover M.D.   On: 03/05/2019 20:51   Ct Head Wo Contrast  Result Date: 03/05/2019 CLINICAL DATA:  Unwitnessed fall with hematoma on back of head. EXAM: CT HEAD WITHOUT CONTRAST CT CERVICAL SPINE WITHOUT CONTRAST TECHNIQUE: Multidetector CT imaging of the head and cervical spine was performed following the standard protocol without intravenous contrast. Multiplanar CT image reconstructions of the cervical spine were also generated. COMPARISON:  02/10/2019 FINDINGS: CT HEAD FINDINGS Brain: No evidence of acute infarction, hemorrhage, hydrocephalus, extra-axial collection or mass lesion/mass effect. There is mild diffuse low-attenuation within the subcortical and periventricular white matter compatible with chronic microvascular disease. Prominence of sulci and ventricles compatible with brain atrophy. Vascular: No hyperdense vessel or unexpected calcification. Skull: Normal. Negative for fracture or focal lesion. Sinuses/Orbits: No acute finding. Other: None CT CERVICAL SPINE FINDINGS Alignment: Normal. Skull base and vertebrae: No acute fracture. No primary bone lesion or focal pathologic process. Soft tissues and spinal canal: No prevertebral fluid or swelling. No visible canal hematoma. Disc levels: There is marked disc space narrowing and endplate spurring identified at C4-5, C5-6 and C6-7. Bilateral facet hypertrophy and degenerative change noted. Upper chest: Negative. Other: None IMPRESSION: 1. No acute intracranial abnormality. Chronic small vessel  ischemic disease and brain atrophy noted. 2. No acute cervical spine findings. Multi level cervical degenerative disc disease. Electronically Signed   By: Signa Kellaylor  Stroud M.D.   On: 03/05/2019 19:54   Ct Cervical Spine Wo Contrast  Result Date: 03/05/2019 CLINICAL DATA:  Unwitnessed fall with hematoma on back of head. EXAM: CT HEAD WITHOUT CONTRAST CT CERVICAL SPINE WITHOUT CONTRAST TECHNIQUE: Multidetector CT imaging of the head and cervical spine was performed following the standard protocol without intravenous contrast. Multiplanar CT image reconstructions of the cervical spine were also generated. COMPARISON:  02/10/2019 FINDINGS: CT HEAD FINDINGS Brain: No evidence of acute infarction, hemorrhage, hydrocephalus, extra-axial collection or mass lesion/mass effect. There is mild diffuse low-attenuation within the subcortical and periventricular white matter compatible with chronic microvascular disease. Prominence of sulci and ventricles compatible with brain atrophy. Vascular: No hyperdense vessel or unexpected calcification. Skull: Normal. Negative for fracture or focal lesion. Sinuses/Orbits: No acute finding. Other: None CT CERVICAL SPINE FINDINGS Alignment: Normal. Skull base and vertebrae: No acute fracture. No primary bone lesion or focal pathologic process. Soft tissues and spinal canal: No prevertebral fluid or swelling. No visible canal hematoma. Disc levels: There is marked disc space narrowing and endplate spurring identified at C4-5, C5-6 and  C6-7. Bilateral facet hypertrophy and degenerative change noted. Upper chest: Negative. Other: None IMPRESSION: 1. No acute intracranial abnormality. Chronic small vessel ischemic disease and brain atrophy noted. 2. No acute cervical spine findings. Multi level cervical degenerative disc disease. Electronically Signed   By: Signa Kellaylor  Stroud M.D.   On: 03/05/2019 19:54   Dg Chest Port 1 View  Result Date: 03/05/2019 CLINICAL DATA:  Syncope.  Dementia. EXAM:  PORTABLE CHEST 1 VIEW COMPARISON:  February 11, 2019 FINDINGS: There is no edema or consolidation. Heart size and pulmonary vascularity are normal. No adenopathy. There is aortic atherosclerosis. No pneumothorax. No bone lesions. IMPRESSION: No edema or consolidation. Stable cardiac silhouette. Aortic Atherosclerosis (ICD10-I70.0). Electronically Signed   By: Bretta BangWilliam  Woodruff III M.D.   On: 03/05/2019 19:34   Dg Hip Unilat With Pelvis 2-3 Views Left  Result Date: 03/05/2019 CLINICAL DATA:  Un witnessed fall. EXAM: DG HIP (WITH OR WITHOUT PELVIS) 2-3V LEFT COMPARISON:  None FINDINGS: There is no evidence of acute fracture or dislocation. Moderate left hip osteoarthritis with joint space narrowing, subchondral sclerosis and marginal spur formation. Mild right hip osteoarthritis. IMPRESSION: 1. No acute fracture identified. 2. Moderate left hip osteoarthritis. Electronically Signed   By: Signa Kellaylor  Stroud M.D.   On: 03/05/2019 20:53   Assessment/Plan Present on Admission:  Anemia  Principal Problem:   Anemia - baseline is unknown, interestingly iron studies done tonight are normal and she had negative FOBT. It is possible she is symptomatic from her anemia of unclear cause in which case she might need further workup and intervention. At minimum, will observe in the hospital due to her falls, recheck CBC in AM and also have PT evaluation.  Active Problems:   HTN (hypertension) - continue home meds   HLD (hyperlipidemia) - continue statin   Dementia (HCC) - return to memory care center when ready for DC  DVT prophylaxis: SCDs   Code Status: FULL Code   Family Communication: None present, though ER provider spoke with husband prior to admission.   Admission status: Observation   Time spent: 35 minutes  Delories Mauri Vergie LivingMohammed Murry Diaz MD [facility] Pager [phone]  If 7PM-7AM, please contact night-coverage [email or web address] Password [email or web address]  03/06/2019, 12:16 AM

## 2019-03-06 NOTE — Evaluation (Signed)
Occupational Therapy Evaluation Patient Details Name: Claire Hoffman MRN: [id number] DOB: 1941-03-18 Today's Date: 03/06/2019    History of Present Illness Ptis a 78 y.o.femalewith PMH significant fordementia (currently residing in memory care unit), HTN, HLD admitted with worsening anemia. She was brought to ER due to an unwitnessed fall, and symptomatic anemia   Clinical Impression   PTA Pt at memory care unit, unsure of assist level needed - but minimally verbal and does not follow commands. Earlier in July with PT was ambualting at supervision level for 200 ft, today she was max to total A for all aspects of ADL due to dementia and lack of following commands. She was mod A for transfer assist for safety and to initiate movements - but she was strong enough to perform without assist. Initially she was supine and would not open eyes even with loud auditory stimulation and physical touch. She did become alert with positional change to EOB. At this time, recommending return to memory care unit and 24 hour supervision. Will defer further OT to her home environment at memory care unit.    Follow Up Recommendations  Supervision/Assistance - 24 hour(Return to Memory Care Unit)    Equipment Recommendations  None recommended by OT    Recommendations for Other Services       Precautions / Restrictions Precautions Precautions: Fall Restrictions Weight Bearing Restrictions: No      Mobility Bed Mobility Overal bed mobility: Needs Assistance Bed Mobility: Supine to Sit;Sit to Supine     Supine to sit: Max assist Sit to supine: Max assist;+2 for safety/equipment   General bed mobility comments: Pt very strong, increased asssit needed due to lack of command following and initially did not arouse at bed level - with positional changes Pt alert  Transfers Overall transfer level: Needs assistance Equipment used: 1 person hand held assist Transfers: Sit to/from [employer]  Transfers Sit to Stand: Mod assist Stand pivot transfers: Mod assist       General transfer comment: Pt strong and able to complete transfers, does not initiate/follow commands - but is capable of performing without assist, mod A for safety and following directions    Balance Overall balance assessment: Mild deficits observed, not formally tested                                         ADL either performed or assessed with clinical judgement   ADL Overall ADL's : At baseline                                       General ADL Comments: Pt needs assist for feeding, total A to max A for all aspects of ADL due to dementia     Vision         Perception     Praxis      Pertinent Vitals/Pain Pain Assessment: Faces Pain Score: 0-No pain Pain Intervention(s): Monitored during session     Hand Dominance     Extremity/Trunk Assessment Upper Extremity Assessment Upper Extremity Assessment: Overall WFL for tasks assessed   Lower Extremity Assessment Lower Extremity Assessment: Defer to PT evaluation   Cervical / Trunk Assessment Cervical / Trunk Assessment: Kyphotic   Communication Communication Communication: Expressive difficulties(mumbles)   Cognition Arousal/Alertness: Awake/alert(initially lethargic, increased arousal w/positional change) Behavior  During Therapy: Restless Overall Cognitive Status: No family/caregiver present to determine baseline cognitive functioning                                 General Comments: hx of dementia, does not follow commands   General Comments       Exercises     Shoulder Instructions      Home Living Family/patient expects to be discharged to:: Assisted living                                 Additional [facility]       Prior Functioning/Environment          Comments: unsure and Pt unreliable historian at least  2 falls since July        OT Problem List: Impaired balance (sitting and/or standing);Decreased safety awareness      OT Treatment/Interventions:      OT Goals(Current goals can be found in the care plan section) Acute Rehab OT Goals Patient Stated Goal: eat! OT Goal Formulation: With patient Time For Goal Achievement: 03/20/19 Potential to Achieve Goals: Fair  OT Frequency:     Barriers to D/C:            Co-evaluation              AM-PAC OT "6 Clicks" Daily Activity     Outcome Measure Help from another person eating meals?: A Lot Help from another person taking care of personal grooming?: A Lot Help from another person toileting, which includes using toliet, bedpan, or urinal?: A Lot Help from another person bathing (including washing, rinsing, drying)?: A Lot Help from another person to put on and taking off regular upper body clothing?: A Lot Help from another person to put on and taking off regular lower body clothing?: Total 6 Click Score: 11   End of Session Equipment Utilized During Treatment: Gait belt Nurse Communication: Mobility status  Activity Tolerance: Patient tolerated treatment well Patient left: in bed;with call bell/phone within reach;with bed alarm set;with nursing/sitter in room  OT Visit Diagnosis: Other abnormalities of gait and mobility (R26.89);Repeated falls (R29.6);History of falling (Z91.81);Other symptoms and signs involving cognitive function                Time: [id number] OT Time Calculation (min): 28 min Charges:  OT General Charges $OT Visit: 1 Visit OT Evaluation $OT Eval Moderate Complexity: 1 Mod  Sherryl MangesLaura Dreux Mcgroarty OTR/L Acute Rehabilitation Services Pager: [phone] Office: [phone]  Evern BioLaura J Kavi Almquist 03/06/2019, 1:47 PM

## 2019-03-06 NOTE — Progress Notes (Signed)
Pt stable at time of rounding with Jovita Kussmaul RN. Pt was confused with limited speech. This is patients baseline. No needs at this time. rn did change pt to soft diet to difficulty eating regular tray.

## 2019-03-07 DIAGNOSIS — F0391 Unspecified dementia with behavioral disturbance: Secondary | ICD-10-CM | POA: Diagnosis present

## 2019-03-07 DIAGNOSIS — Z79890 Hormone replacement therapy (postmenopausal): Secondary | ICD-10-CM | POA: Diagnosis not present

## 2019-03-07 DIAGNOSIS — Z20828 Contact with and (suspected) exposure to other viral communicable diseases: Secondary | ICD-10-CM | POA: Diagnosis present

## 2019-03-07 DIAGNOSIS — Y92129 Unspecified place in nursing home as the place of occurrence of the external cause: Secondary | ICD-10-CM | POA: Diagnosis not present

## 2019-03-07 DIAGNOSIS — G40909 Epilepsy, unspecified, not intractable, without status epilepticus: Secondary | ICD-10-CM | POA: Diagnosis present

## 2019-03-07 DIAGNOSIS — W19XXXA Unspecified fall, initial encounter: Secondary | ICD-10-CM | POA: Diagnosis present

## 2019-03-07 DIAGNOSIS — D509 Iron deficiency anemia, unspecified: Secondary | ICD-10-CM | POA: Diagnosis present

## 2019-03-07 DIAGNOSIS — E785 Hyperlipidemia, unspecified: Secondary | ICD-10-CM | POA: Diagnosis present

## 2019-03-07 DIAGNOSIS — D649 Anemia, unspecified: Secondary | ICD-10-CM | POA: Diagnosis present

## 2019-03-07 DIAGNOSIS — S51012A Laceration without foreign body of left elbow, initial encounter: Secondary | ICD-10-CM | POA: Diagnosis present

## 2019-03-07 DIAGNOSIS — E039 Hypothyroidism, unspecified: Secondary | ICD-10-CM | POA: Diagnosis present

## 2019-03-07 DIAGNOSIS — E860 Dehydration: Secondary | ICD-10-CM | POA: Diagnosis present

## 2019-03-07 DIAGNOSIS — I10 Essential (primary) hypertension: Secondary | ICD-10-CM | POA: Diagnosis present

## 2019-03-07 DIAGNOSIS — Z7982 Long term (current) use of aspirin: Secondary | ICD-10-CM | POA: Diagnosis not present

## 2019-03-07 DIAGNOSIS — R58 Hemorrhage, not elsewhere classified: Secondary | ICD-10-CM | POA: Diagnosis present

## 2019-03-07 DIAGNOSIS — I951 Orthostatic hypotension: Secondary | ICD-10-CM | POA: Diagnosis present

## 2019-03-07 LAB — CBC
HCT: 27.3 % — ABNORMAL LOW (ref 36.0–46.0)
Hemoglobin: 8.2 g/dL — ABNORMAL LOW (ref 12.0–15.0)
MCH: 29.8 pg (ref 26.0–34.0)
MCHC: 30 g/dL (ref 30.0–36.0)
MCV: 99.3 fL (ref 80.0–100.0)
Platelets: 281 10*3/uL (ref 150–400)
RBC: 2.75 MIL/uL — ABNORMAL LOW (ref 3.87–5.11)
RDW: 14.6 % (ref 11.5–15.5)
WBC: 5.8 10*3/uL (ref 4.0–10.5)
nRBC: 0 % (ref 0.0–0.2)

## 2019-03-07 LAB — ABO/RH: ABO/RH(D): O POS

## 2019-03-07 LAB — URINE CULTURE: Culture: NO GROWTH

## 2019-03-07 MED ORDER — SODIUM CHLORIDE 0.9 % IV SOLN
INTRAVENOUS | Status: DC
  Administered 2019-03-07 – 2019-03-13 (×14): via INTRAVENOUS

## 2019-03-07 MED ORDER — VITAMIN C 500 MG PO TABS
500.0000 mg | ORAL_TABLET | Freq: Two times a day (BID) | ORAL | Status: DC
  Administered 2019-03-07 – 2019-03-14 (×13): 500 mg via ORAL
  Filled 2019-03-07 (×13): qty 1

## 2019-03-07 MED ORDER — ADULT MULTIVITAMIN W/MINERALS CH
1.0000 | ORAL_TABLET | Freq: Every day | ORAL | Status: DC
  Administered 2019-03-09 – 2019-03-14 (×6): 1 via ORAL
  Filled 2019-03-07 (×6): qty 1

## 2019-03-07 NOTE — Evaluation (Signed)
Physical Therapy Evaluation Patient Details Name: Claire Hoffman MRN: [id number] DOB: 1941/06/10 Today's Date: 03/07/2019   History of Present Illness  Pt is a 78 y.o. female with PMH significant for dementia (currently residing in memory care unit), HTN, HLD admitted with worsening anemia. She was brought to ER due to an unwitnessed fall, and symptomatic anemia    Clinical Impression  Claire Hoffman is a 78 y.o. female with above HPI admitted following a fall at the memory care facility she presently resides. Pt was unable to follow commands and at start of session was minimally alert with eyes closed while sitting in chair. She did become more alert with position change to lean forward and chair and upon standing. Prior hospitalization indicates pt was ambulatory in early July at supervision level. Today patient required Mod assist for sit to stand transfers and to maintain standing balance. She required mod assist to ambulate short distance in hospital room to return to bed. Max assist required to transfer sit to supine and repostition in bed. During mobility patient was reaching for objects and demonstrated a strong grip required verbal and tactile cues to deter holding onto objects to reduce risk of falling. Recommend patient return to memory care unit with 24 hour supervision; if unable to provide assistance level required and supervision recommend SNF. Acute PT will follow.    Follow Up Recommendations LTACH;SNF;Supervision/Assistance - 24 hour(if memory care facility patient was staying at cannot provide supervision of care pt will need SNF)    Equipment Recommendations  None recommended by PT    Recommendations for Other Services       Precautions / Restrictions Precautions Precautions: Fall Restrictions Weight Bearing Restrictions: No      Mobility  Bed Mobility Overal bed mobility: Needs Assistance Bed Mobility: Sit to Supine       Sit to supine: Max assist    General bed mobility comments: Pt with strong grip, pt requires increased assist due decreased ability to follow simple commands, pt was seated in chair at start of session with eyes closed, was arrousable by cues and assist to sit forward/chagne position in chair.  Transfers Overall transfer level: Needs assistance Equipment used: 1 person hand held assist Transfers: Sit to/from Stand Sit to Stand: Mod assist         General transfer comment: Pt strong and able to complete transfers, requires assist to initiate transfers and doesn not follow commands, mod A for safety and following directions  Ambulation/Gait Ambulation/Gait assistance: Mod assist Gait Distance (Feet): 6 Feet Assistive device: 1 person hand held assist Gait Pattern/deviations: Step-to pattern;Decreased step length - left;Decreased step length - right;Decreased stride length;Shuffle;Narrow base of support;Trunk flexed Gait velocity: slow indicative of high fall risk   General Gait Details: pt with slow and unsteady gait, pt requried mod assist to direct gait in hospital room and cues to maintain upright posture, pt reaching for equipment in room during ambulation (coutner, bed rail, dynamap) requried redirection to deter holding onto objects  Stairs            Wheelchair Mobility    Modified Rankin (Stroke Patients Only)       Balance Overall balance assessment: Needs assistance Sitting-balance support: Feet supported;Bilateral upper extremity supported Sitting balance-Leahy Scale: Fair Sitting balance - Comments: pt abel to sit upright in chair for several seconds but reliant on back rest for support with tendency to rest back Postural control: Posterior lean Standing balance support: Single extremity supported;During functional activity Standing balance-Leahy  Scale: Poor Standing balance comment: pt requires support via 1 person hand held assist, significant posterior lean in initial standing                 Pertinent Vitals/Pain Pain Assessment: Faces Pain Score: 0-No pain Faces Pain Scale: No hurt    Home Living Family/patient expects to be discharged to:: Assisted living            Additional Comments[facility]     Prior Function Level of Independence: Independent         Comments: unsure and Pt unreliable historian at least 2 falls since July     Hand Dominance        Extremity/Trunk Assessment   Upper Extremity Assessment Upper Extremity Assessment: Defer to OT evaluation    Lower Extremity Assessment Lower Extremity Assessment: Generalized weakness    Cervical / Trunk Assessment Cervical / Trunk Assessment: Kyphotic  Communication   Communication: Expressive difficulties(mumbles)  Cognition Arousal/Alertness: Awake/alert(initially lethargic, increased arousal w/positional change) Behavior During Therapy: Restless Overall Cognitive Status: No family/caregiver present to determine baseline cognitive functioning            General Comments: hx of dementia, does not follow commands      General Comments General comments (skin integrity, edema, etc.): pt with frail skin and multiple foam padded silicon dressings for skin tears.        Assessment/Plan    PT Assessment Patient needs continued PT services  PT Problem List Decreased strength;Decreased activity tolerance;Decreased balance;Decreased mobility;Decreased cognition;Decreased safety awareness       PT Treatment Interventions Gait training;Functional mobility training;Therapeutic activities;Therapeutic exercise;Balance training;Patient/family education    PT Goals (Current goals can be found in the Care Plan section)  Acute Rehab PT Goals Patient Stated Goal: pt did not verbalize any goals, improve transfer and gait safety PT Goal Formulation: Patient unable to participate in goal setting Time For Goal Achievement: 03/14/19 Potential to  Achieve Goals: Good    Frequency Min 2X/week    AM-PAC PT "6 Clicks" Mobility  Outcome Measure Help needed turning from your back to your side while in a flat bed without using bedrails?: A Lot Help needed moving from lying on your back to sitting on the side of a flat bed without using bedrails?: Total Help needed moving to and from a bed to a chair (including a wheelchair)?: A Lot Help needed standing up from a chair using your arms (e.g., wheelchair or bedside chair)?: A Lot Help needed to walk in hospital room?: A Lot Help needed climbing 3-5 steps with a railing? : Total 6 Click Score: 10    End of Session Equipment Utilized During Treatment: Gait belt Activity Tolerance: Patient tolerated treatment well Patient left: in bed Nurse Communication: Mobility status PT Visit Diagnosis: Unsteadiness on feet (R26.81);History of falling (Z91.81);Difficulty in walking, not elsewhere classified (R26.2)    Time: [id number] PT Time Calculation (min) (ACUTE ONLY): 33 min   Charges:   PT Evaluation $PT Eval Moderate Complexity: 1 Mod PT Treatments $Therapeutic Activity: 8-22 mins       Valentino Saxonachel Quinn-Brown, PT, DPT, [facility] Physical Therapist with [facility]  03/07/2019 1:35 PM

## 2019-03-07 NOTE — Progress Notes (Signed)
Initial Nutrition Assessment  DOCUMENTATION CODES:   Not applicable  INTERVENTION:   Ensure Enlive po BID, each supplement provides 350 kcal and 20 grams of protein  Magic cup TID with meals, each supplement provides 290 kcal and 9 grams of protein  MVI daily   Vitamin C 500mg  po BID  Dysphagia 3 diet   Pt at moderate refeed risk; recommend monitor K, Mg and P labs daily until stable.   NUTRITION DIAGNOSIS:   Inadequate oral intake related to acute illness as evidenced by other (comment)(per chart review).  GOAL:   Patient will meet greater than or equal to 90% of their needs  MONITOR:   PO intake, Supplement acceptance, Labs, Weight trends, Skin, I & O's  REASON FOR ASSESSMENT:   Consult Assessment of nutrition requirement/status  ASSESSMENT:   78 y.o. female with PMH significant for dementia (currently residing in memory care unit), HTN, HLD admitted with worsening anemia. She was brought to ER due to an unwitnessed fall, and symptomatic anemia  RD working remotely.  Unable to speak with patient as pt is demented and non-verbal at baseline. Pt documented to be eating 25-75% of meals in hospital. There is no documented weights in chart to confirm if any significant weight loss. Pt noted to have widespread ecchymosis; would recommend vitamin C supplementation in setting of possible deficiency. Patient appears malnourished via visualization of pictures in chart but unable to diagnose at this time as nutrition focused physical exam cannot be performed. RD will add supplements and vitamins to help patient meet her estimated needs. Suspect pt at moderate refeed risk; recommend monitor electrolytes daily until stable.   Medications reviewed and include: aspirin, synthroid, NaCl @100ml /hr  Labs reviewed: Iron 39 wnl, TIBC 282, ferritin 119, folate 11.5, B12 476- 8/1 Hgb 8.2(L), Hct 27.3(L)  Unable to complete Nutrition-Focused physical exam at this time.   Diet Order:    Diet Order            DIET SOFT Room service appropriate? Yes; Fluid consistency: Thin  Diet effective now             EDUCATION NEEDS:   Not appropriate for education at this time  Skin:  Skin Assessment: Reviewed RN Assessment(ecchymosis)  Last BM:  pta  Height:   Ht Readings from Last 1 Encounters:  No data found for Ht    Weight:   Wt Readings from Last 1 Encounters:  03/06/19 47.1 kg    Ideal Body Weight:     BMI:  There is no height or weight on file to calculate BMI.  Estimated Nutritional Needs:   Kcal:  1300-1500kcal/day  Protein:  70-80g/day  Fluid:  >1.2L/day  Koleen Distance MS, RD, LDN Pager #- [phone] Office#- [phone] After Hours Pager: [phone]

## 2019-03-07 NOTE — Progress Notes (Signed)
PROGRESS NOTE    Claire Hoffman  VZD:[id number] DOB: Dec 22, 1940 DOA: 03/05/2019 PCP: Sande Brothers, MD     Brief Narrative:  Claire Hoffman a 78 y.o.femalewith medical history significant fordementia and nonverbal currently residing in memory care unit, HTN, HLD admitted with worsening anemia. She does not have a known history of anemia, however in early July had Hb of 10. She was brought to ER tonight due to an unwitnessed fall, with history taken from ER provider.  Patient was admitted for further work-up of symptomatic anemia.    New events last 24 hours / Subjective: No new events overnight.  Patient remains nonverbal, sitting in chair.  Assessment & Plan:   Principal Problem:   Anemia Active Problems:   HTN (hypertension)   HLD (hyperlipidemia)   Dementia (HCC)    Fall -CT head and cervical spine without acute abnormality -X-ray of head negative for acute fracture -X-ray left ankle negative for acute fracture -X-ray left tibia-fibula negative for acute fracture -UA negative for urinary tract infection, culture is negative  -PT OT  Orthostatic hypotension -Orthostatic VS negative yesterday, 115/74-->110/60, worse today 133/84-->95/49 -Hold lisinopril, amlodipine -IVF started today, ?PO intake adequate   Symptomatic anemia -No sign of GI bleed, FOBT negative, iron studies negative for iron deficiency anemia -Hgb 8.1-->7.7-->8.2  -Vit B12 476, folate 11.5  -Hold aspirin  HTN -Hold lisinopril, amlodipine  HLD  -Continue Lipitor  Seizure disorder -Continue Depakote  Hypothyroidism -Continue Synthroid -Check TSH   Dementia -Nonverbal at baseline, resides at memory care unit -Would recommend palliative care follow up as patient remains a full code currently  -Continue Zyprexa        DVT prophylaxis: SCD Code Status: Full Family Communication: Left voicemail to husband's phone yesterday and today Disposition Plan: Pending improvement in  orthostatic VS. IVF started today. Back to memory care unit on discharge.   Consultants:   None  Procedures:   None   Antimicrobials:  Anti-infectives (From admission, onward)   None       Objective: Vitals:   03/07/19 0746 03/07/19 0900 03/07/19 0920 03/07/19 0941  BP: (!) 95/49 129/67 (!) 92/57 121/76  Pulse: 82 87 78 72  Resp:      Temp:      TempSrc:      SpO2: (!) 84%   100%  Weight:        Intake/Output Summary (Last 24 hours) at 03/07/2019 1239 Last data filed at 03/07/2019 1217 Gross per 24 hour  Intake 1040 ml  Output 1600 ml  Net -560 ml   Filed Weights   03/06/19 0100  Weight: 47.1 kg    Examination:  General exam: Appears calm and comfortable  Respiratory system: Clear to auscultation. Respiratory effort normal. Cardiovascular system: S1 & S2 heard, RRR. No JVD, murmurs, rubs, gallops or clicks. No pedal edema. Gastrointestinal system: Abdomen is nondistended, soft and nontender. No organomegaly or masses felt. Normal bowel sounds heard. Central nervous system: Alert Extremities: Symmetric 5 x 5 power. Skin: No rashes, lesions or ulcers Psychiatry: Advanced dementia  Data Reviewed: I have personally reviewed following labs and imaging studies  CBC: Recent Labs  Lab 03/05/19 2032 03/06/19 0351 03/07/19 0323  WBC 8.0 6.4 5.8  NEUTROABS 5.9  --   --   HGB 8.1* 7.7* 8.2*  HCT 25.9* 26.1* 27.3*  MCV 100.0 102.4* 99.3  PLT 249 256 295   Basic Metabolic Panel: Recent Labs  Lab 03/05/19 2032  NA 139  K 4.2  CL  105  CO2 26  GLUCOSE 104*  BUN 19  CREATININE 0.60  CALCIUM 8.6*   GFR: CrCl cannot be calculated (Unknown ideal weight.). Liver Function Tests: Recent Labs  Lab 03/06/19 0351  AST 28  ALT 35  ALKPHOS 65  BILITOT 1.2  PROT 5.9*  ALBUMIN 3.1*   No results for input(s): LIPASE, AMYLASE in the last 168 hours. No results for input(s): AMMONIA in the last 168 hours. Coagulation Profile: Recent Labs  Lab 03/05/19 2248   INR 1.0   Cardiac Enzymes: No results for input(s): CKTOTAL, CKMB, CKMBINDEX, TROPONINI in the last 168 hours. BNP (last 3 results) No results for input(s): PROBNP in the last 8760 hours. HbA1C: No results for input(s): HGBA1C in the last 72 hours. CBG: No results for input(s): GLUCAP in the last 168 hours. Lipid Profile: No results for input(s): CHOL, HDL, LDLCALC, TRIG, CHOLHDL, LDLDIRECT in the last 72 hours. Thyroid Function Tests: No results for input(s): TSH, T4TOTAL, FREET4, T3FREE, THYROIDAB in the last 72 hours. Anemia Panel: Recent Labs    03/05/19 2248  VITAMINB12 476  FOLATE 11.5  FERRITIN 119  TIBC 282  IRON 39  RETICCTPCT 4.7*   Sepsis Labs: Recent Labs  Lab 03/05/19 2032  LATICACIDVEN 1.2    Recent Results (from the past 240 hour(s))  Urine culture     Status: None   Collection Time: 03/05/19  7:05 PM   Specimen: Urine, Clean Catch  Result Value Ref Range Status   Specimen Description   Final    URINE, CLEAN CATCH Performed at [facility], [address] [place], [place] [address]    Special Requests   Final    NONE Performed at [facility], [address] [place], [place] [address]    Culture   Final    NO GROWTH Performed at [facility], [address] [place], [place] [address]    Report Status 03/07/2019 FINAL  Final  SARS CORONAVIRUS 2 Nasal Swab Aptima Multi Swab     Status: None   Collection Time: 03/06/19 12:23 AM   Specimen: Aptima Multi Swab; Nasal Swab  Result Value Ref Range Status   SARS Coronavirus 2 NEGATIVE NEGATIVE Final    Comment: (NOTE) SARS-CoV-2 target nucleic acids are NOT DETECTED. The SARS-CoV-2 RNA is generally detectable in upper and lower respiratory specimens during the acute phase of infection. Negative results do not preclude SARS-CoV-2 infection, do not rule out co-infections with other pathogens, and should not be used as the sole basis for treatment or other  patient management decisions. Negative results must be combined with clinical observations, patient history, and epidemiological information. The expected result is Negative. Fact Sheet for Patients: HairSlick.nohttps://www.fda.gov/media/138098/download Fact Sheet for Healthcare Providers: quierodirigir.comhttps://www.fda.gov/media/138095/download This test is not yet approved or cleared by the [place] FDA and  has been authorized for detection and/or diagnosis of SARS-CoV-2 by FDA under an Emergency Use Authorization (EUA). This EUA will remain  in effect (meaning this test can be used) for the duration of the COVID-19 declaration under Section 56 4(b)(1) of the Act, 21 U.S.C. section 360bbb-3(b)(1), unless the authorization is terminated or revoked sooner. Performed at [facility], [address] [place], [place] [address]       Radiology Studies: Dg Tibia/fibula Left  Result Date: 03/05/2019 CLINICAL DATA:  Fall, bruising EXAM: LEFT TIBIA AND FIBULA - 2 VIEW COMPARISON:  None. FINDINGS: There is no evidence of fracture or other focal bone  lesions. Soft tissues are unremarkable. IMPRESSION: Negative. Electronically Signed   By: Charlett NoseKevin  Dover M.D.   On: 03/05/2019 20:52   Dg Ankle Complete Left  Result Date: 03/05/2019 CLINICAL DATA:  Fall EXAM: LEFT ANKLE COMPLETE - 3+ VIEW COMPARISON:  None. FINDINGS: There is no evidence of fracture, dislocation, or joint effusion. There is no evidence of arthropathy or other focal bone abnormality. Soft tissues are unremarkable. IMPRESSION: Negative. Electronically Signed   By: Charlett NoseKevin  Dover M.D.   On: 03/05/2019 20:51   Ct Head Wo Contrast  Result Date: 03/05/2019 CLINICAL DATA:  Unwitnessed fall with hematoma on back of head. EXAM: CT HEAD WITHOUT CONTRAST CT CERVICAL SPINE WITHOUT CONTRAST TECHNIQUE: Multidetector CT imaging of the head and cervical spine was performed following the standard protocol without intravenous contrast. Multiplanar CT image  reconstructions of the cervical spine were also generated. COMPARISON:  02/10/2019 FINDINGS: CT HEAD FINDINGS Brain: No evidence of acute infarction, hemorrhage, hydrocephalus, extra-axial collection or mass lesion/mass effect. There is mild diffuse low-attenuation within the subcortical and periventricular white matter compatible with chronic microvascular disease. Prominence of sulci and ventricles compatible with brain atrophy. Vascular: No hyperdense vessel or unexpected calcification. Skull: Normal. Negative for fracture or focal lesion. Sinuses/Orbits: No acute finding. Other: None CT CERVICAL SPINE FINDINGS Alignment: Normal. Skull base and vertebrae: No acute fracture. No primary bone lesion or focal pathologic process. Soft tissues and spinal canal: No prevertebral fluid or swelling. No visible canal hematoma. Disc levels: There is marked disc space narrowing and endplate spurring identified at C4-5, C5-6 and C6-7. Bilateral facet hypertrophy and degenerative change noted. Upper chest: Negative. Other: None IMPRESSION: 1. No acute intracranial abnormality. Chronic small vessel ischemic disease and brain atrophy noted. 2. No acute cervical spine findings. Multi level cervical degenerative disc disease. Electronically Signed   By: Signa Kellaylor  Stroud M.D.   On: 03/05/2019 19:54   Ct Cervical Spine Wo Contrast  Result Date: 03/05/2019 CLINICAL DATA:  Unwitnessed fall with hematoma on back of head. EXAM: CT HEAD WITHOUT CONTRAST CT CERVICAL SPINE WITHOUT CONTRAST TECHNIQUE: Multidetector CT imaging of the head and cervical spine was performed following the standard protocol without intravenous contrast. Multiplanar CT image reconstructions of the cervical spine were also generated. COMPARISON:  02/10/2019 FINDINGS: CT HEAD FINDINGS Brain: No evidence of acute infarction, hemorrhage, hydrocephalus, extra-axial collection or mass lesion/mass effect. There is mild diffuse low-attenuation within the subcortical and  periventricular white matter compatible with chronic microvascular disease. Prominence of sulci and ventricles compatible with brain atrophy. Vascular: No hyperdense vessel or unexpected calcification. Skull: Normal. Negative for fracture or focal lesion. Sinuses/Orbits: No acute finding. Other: None CT CERVICAL SPINE FINDINGS Alignment: Normal. Skull base and vertebrae: No acute fracture. No primary bone lesion or focal pathologic process. Soft tissues and spinal canal: No prevertebral fluid or swelling. No visible canal hematoma. Disc levels: There is marked disc space narrowing and endplate spurring identified at C4-5, C5-6 and C6-7. Bilateral facet hypertrophy and degenerative change noted. Upper chest: Negative. Other: None IMPRESSION: 1. No acute intracranial abnormality. Chronic small vessel ischemic disease and brain atrophy noted. 2. No acute cervical spine findings. Multi level cervical degenerative disc disease. Electronically Signed   By: Signa Kellaylor  Stroud M.D.   On: 03/05/2019 19:54   Dg Chest Port 1 View  Result Date: 03/05/2019 CLINICAL DATA:  Syncope.  Dementia. EXAM: PORTABLE CHEST 1 VIEW COMPARISON:  February 11, 2019 FINDINGS: There is no edema or consolidation. Heart size and pulmonary vascularity are normal. No  adenopathy. There is aortic atherosclerosis. No pneumothorax. No bone lesions. IMPRESSION: No edema or consolidation. Stable cardiac silhouette. Aortic Atherosclerosis (ICD10-I70.0). Electronically Signed   By: Bretta BangWilliam  Woodruff III M.D.   On: 03/05/2019 19:34   Dg Hip Unilat With Pelvis 2-3 Views Left  Result Date: 03/05/2019 CLINICAL DATA:  Un witnessed fall. EXAM: DG HIP (WITH OR WITHOUT PELVIS) 2-3V LEFT COMPARISON:  None FINDINGS: There is no evidence of acute fracture or dislocation. Moderate left hip osteoarthritis with joint space narrowing, subchondral sclerosis and marginal spur formation. Mild right hip osteoarthritis. IMPRESSION: 1. No acute fracture identified. 2. Moderate  left hip osteoarthritis. Electronically Signed   By: Signa Kellaylor  Stroud M.D.   On: 03/05/2019 20:53      Scheduled Meds: . aspirin  81 mg Oral Daily  . atorvastatin  10 mg Oral Daily  . divalproex  125 mg Oral BID  . feeding supplement (ENSURE ENLIVE)  237 mL Oral BID BM  . levothyroxine  75 mcg Oral Q0600  . OLANZapine  5 mg Oral QHS   Continuous Infusions: . sodium chloride 100 mL/hr at 03/07/19 1200     LOS: 0 days      Time spent: 25 minutes   Noralee StainJennifer Arvis Miguez, DO [facility] [email or web address] 03/07/2019, 12:39 PM

## 2019-03-07 NOTE — Plan of Care (Signed)
  Problem: Acute Rehab PT Goals(only PT should resolve) Goal: Pt Will Go Supine/Side To Sit Outcome: Progressing Flowsheets (Taken 03/07/2019 1342) Pt will go Supine/Side to Sit: with moderate assist Goal: Pt Will Go Sit To Supine/Side Outcome: Progressing Flowsheets (Taken 03/07/2019 1342) Pt will go Sit to Supine/Side: with moderate assist Goal: Patient Will Transfer Sit To/From Stand Outcome: Progressing Flowsheets (Taken 03/07/2019 1342) Patient will transfer sit to/from stand: with minimal assist Goal: Pt Will Transfer Bed To Chair/Chair To Bed Outcome: Progressing Flowsheets (Taken 03/07/2019 1342) Pt will Transfer Bed to Chair/Chair to Bed: with min assist Goal: Pt Will Ambulate Outcome: Progressing Flowsheets (Taken 03/07/2019 1342) Pt will Ambulate:  25 feet  with minimal assist

## 2019-03-07 NOTE — Plan of Care (Signed)
?  Problem: Clinical Measurements: ?Goal: Will remain free from infection ?Outcome: Progressing ?Goal: Diagnostic test results will improve ?Outcome: Progressing ?Goal: Respiratory complications will improve ?Outcome: Progressing ?  ?

## 2019-03-08 LAB — TSH: TSH: 5.133 u[IU]/mL — ABNORMAL HIGH (ref 0.350–4.500)

## 2019-03-08 LAB — CBC
HCT: 25.9 % — ABNORMAL LOW (ref 36.0–46.0)
Hemoglobin: 7.7 g/dL — ABNORMAL LOW (ref 12.0–15.0)
MCH: 30 pg (ref 26.0–34.0)
MCHC: 29.7 g/dL — ABNORMAL LOW (ref 30.0–36.0)
MCV: 100.8 fL — ABNORMAL HIGH (ref 80.0–100.0)
Platelets: 257 10*3/uL (ref 150–400)
RBC: 2.57 MIL/uL — ABNORMAL LOW (ref 3.87–5.11)
RDW: 14.8 % (ref 11.5–15.5)
WBC: 6.9 10*3/uL (ref 4.0–10.5)
nRBC: 0 % (ref 0.0–0.2)

## 2019-03-08 LAB — CORTISOL-AM, BLOOD: Cortisol - AM: 9.8 ug/dL (ref 6.7–22.6)

## 2019-03-08 LAB — BASIC METABOLIC PANEL
Anion gap: 7 (ref 5–15)
BUN: 15 mg/dL (ref 8–23)
CO2: 26 mmol/L (ref 22–32)
Calcium: 8.3 mg/dL — ABNORMAL LOW (ref 8.9–10.3)
Chloride: 107 mmol/L (ref 98–111)
Creatinine, Ser: 0.57 mg/dL (ref 0.44–1.00)
GFR calc Af Amer: >60 mL/min (ref >60)
GFR calc non Af Amer: >60 mL/min (ref >60)
Glucose, Bld: 87 mg/dL (ref 70–99)
Potassium: 3.8 mmol/L (ref 3.5–5.1)
Sodium: 140 mmol/L (ref 135–145)

## 2019-03-08 LAB — T4, FREE: Free T4: 1.07 ng/dL (ref 0.61–1.12)

## 2019-03-08 LAB — VALPROIC ACID LEVEL: Valproic Acid Lvl: 12 ug/mL — ABNORMAL LOW (ref 50.0–100.0)

## 2019-03-08 NOTE — Progress Notes (Signed)
Patient is currently sleeping soundly , unable to arouse enough to safely administer oral meds. Will continue to check with patient and administer meds when patient is more alert.

## 2019-03-08 NOTE — Progress Notes (Signed)
PROGRESS NOTE    Claire BaldyJacqueline Hoffman  ZOX:[id number] DOB: [birth year] DOA: 03/05/2019 PCP: Ron ParkerBowen, Samuel, MD     Brief Narrative:  Claire ElliotJacqueline Hoffman a 78 y.o.femalewith medical history significant fordementia and nonverbal currently residing in memory care unit, HTN, HLD admitted with worsening anemia. She does not have a known history of anemia, however in early July had Hb of 10. She was brought to ER tonight due to an unwitnessed fall, with history taken from ER provider.  Patient was admitted for further work-up of symptomatic anemia. Hgb has remained stable, without evidence of acute bleeding.  New events last 24 hours / Subjective: Patient sleeping in bed today, difficult to get patient up and tested for orthostatic VS Today   Assessment & Plan:   Principal Problem:   Anemia Active Problems:   HTN (hypertension)   HLD (hyperlipidemia)   Dementia (HCC)   Symptomatic anemia    Fall -CT head and cervical spine without acute abnormality -X-ray of head negative for acute fracture -X-ray left ankle negative for acute fracture -X-ray left tibia-fibula negative for acute fracture -UA negative for urinary tract infection, culture is negative  -PT OT  Orthostatic hypotension -Orthostatic VS negative 8/2 115/74-->110/60, then worse 8/3 133/84-->95/49. Unable to test today as patient very somnolent  -Hold lisinopril, amlodipine -IVF, ?PO intake adequate   Symptomatic anemia -No sign of GI bleed, FOBT negative, iron studies negative for iron deficiency anemia -Hgb 8.1-->7.7-->8.2--> 7.7 -Vit B12 476, folate 11.5  -Hold aspirin  HTN -Hold lisinopril, amlodipine  HLD  -Continue Lipitor  Seizure disorder -Continue Depakote -Check depakote level   Hypothyroidism -Continue Synthroid -TSH high but free T4 normal   Dementia -Nonverbal at baseline, resides at memory care unit -Would recommend palliative care follow up as patient remains a full code currently   -Zyprexa - hold tonight. Patient very somnolent today     DVT prophylaxis: SCD Code Status: Full Family Communication: None  Disposition Plan: Pending improvement in orthostatic VS and somnolence. Back to memory care unit on discharge.   Consultants:   None  Procedures:   None   Antimicrobials:  Anti-infectives (From admission, onward)   None       Objective: Vitals:   03/07/19 1849 03/07/19 2245 03/08/19 0450 03/08/19 1400  BP: 135/86 (!) 155/88 135/88 (!) 155/83  Pulse: 93 79 81 86  Resp: 16 16 17 16   Temp: 98.2 F (36.8 C) 97.7 F (36.5 C) 97.8 F (36.6 C) 98.1 F (36.7 C)  TempSrc: Axillary Oral Axillary Axillary  SpO2: 100% 100% 100% 100%  Weight:        Intake/Output Summary (Last 24 hours) at 03/08/2019 1524 Last data filed at 03/08/2019 1500 Gross per 24 hour  Intake 1408.68 ml  Output 1050 ml  Net 358.68 ml   Filed Weights   03/06/19 0100  Weight: 47.1 kg    Examination: General exam: Appears calm and comfortable, somnolent this morning   Respiratory system: Clear to auscultation. Respiratory effort normal. Cardiovascular system: S1 & S2 heard, RRR. No JVD, murmurs, rubs, gallops or clicks. No pedal edema. Gastrointestinal system: Abdomen is nondistended, soft and nontender. C Data Reviewed: I have personally reviewed following labs and imaging studies  CBC: Recent Labs  Lab 03/05/19 2032 03/06/19 0351 03/07/19 0323 03/08/19 0348  WBC 8.0 6.4 5.8 6.9  NEUTROABS 5.9  --   --   --   HGB 8.1* 7.7* 8.2* 7.7*  HCT 25.9* 26.1* 27.3* 25.9*  MCV 100.0 102.4* 99.3 100.8*  PLT 249 256 281 257   Basic Metabolic Panel: Recent Labs  Lab 03/05/19 2032 03/08/19 0348  NA 139 140  K 4.2 3.8  CL 105 107  CO2 26 26  GLUCOSE 104* 87  BUN 19 15  CREATININE 0.60 0.57  CALCIUM 8.6* 8.3*   GFR: CrCl cannot be calculated (Unknown ideal weight.). Liver Function Tests: Recent Labs  Lab 03/06/19 0351  AST 28  ALT 35  ALKPHOS 65  BILITOT 1.2   PROT 5.9*  ALBUMIN 3.1*   No results for input(s): LIPASE, AMYLASE in the last 168 hours. No results for input(s): AMMONIA in the last 168 hours. Coagulation Profile: Recent Labs  Lab 03/05/19 2248  INR 1.0   Cardiac Enzymes: No results for input(s): CKTOTAL, CKMB, CKMBINDEX, TROPONINI in the last 168 hours. BNP (last 3 results) No results for input(s): PROBNP in the last 8760 hours. HbA1C: No results for input(s): HGBA1C in the last 72 hours. CBG: No results for input(s): GLUCAP in the last 168 hours. Lipid Profile: No results for input(s): CHOL, HDL, LDLCALC, TRIG, CHOLHDL, LDLDIRECT in the last 72 hours. Thyroid Function Tests: Recent Labs    03/08/19 0348  TSH 5.133*  FREET4 1.07   Anemia Panel: Recent Labs    03/05/19 2248  VITAMINB12 476  FOLATE 11.5  FERRITIN 119  TIBC 282  IRON 39  RETICCTPCT 4.7*   Sepsis Labs: Recent Labs  Lab 03/05/19 2032  LATICACIDVEN 1.2    Recent Results (from the past 240 hour(s))  Urine culture     Status: None   Collection Time: 03/05/19  7:05 PM   Specimen: Urine, Clean Catch  Result Value Ref Range Status   Specimen Description   Final    URINE, CLEAN CATCH Performed at [facility], [address] [place], [place] [address]    Special Requests   Final    NONE Performed at [facility], [address] [place], [place] [address]    Culture   Final    NO GROWTH Performed at [facility], [address] [place], [place] [address]    Report Status 03/07/2019 FINAL  Final  SARS CORONAVIRUS 2 Nasal Swab Aptima Multi Swab     Status: None   Collection Time: 03/06/19 12:23 AM   Specimen: Aptima Multi Swab; Nasal Swab  Result Value Ref Range Status   SARS Coronavirus 2 NEGATIVE NEGATIVE Final    Comment: (NOTE) SARS-CoV-2 target nucleic acids are NOT DETECTED. The SARS-CoV-2 RNA is generally detectable in upper and lower respiratory specimens during the acute phase of  infection. Negative results do not preclude SARS-CoV-2 infection, do not rule out co-infections with other pathogens, and should not be used as the sole basis for treatment or other patient management decisions. Negative results must be combined with clinical observations, patient history, and epidemiological information. The expected result is Negative. Fact Sheet for Patients: HairSlick.nohttps://www.fda.gov/media/138098/download Fact Sheet for Healthcare Providers: quierodirigir.comhttps://www.fda.gov/media/138095/download This test is not yet approved or cleared by the [place] FDA and  has been authorized for detection and/or diagnosis of SARS-CoV-2 by FDA under an Emergency Use Authorization (EUA). This EUA will remain  in effect (meaning this test can be used) for the duration of the COVID-19 declaration under Section 56 4(b)(1) of the Act, 21 U.S.C. section 360bbb-3(b)(1), unless the authorization is terminated or revoked sooner. Performed at [facility], [address] [place], [place] [address]       Radiology  Studies: No results found.    Scheduled Meds: . aspirin  81 mg Oral Daily  . atorvastatin  10 mg Oral Daily  . divalproex  125 mg Oral BID  . feeding supplement (ENSURE ENLIVE)  237 mL Oral BID BM  . levothyroxine  75 mcg Oral Q0600  . multivitamin with minerals  1 tablet Oral Daily  . OLANZapine  5 mg Oral QHS  . vitamin C  500 mg Oral BID   Continuous Infusions: . sodium chloride 100 mL/hr at 03/08/19 0912     LOS: 1 day      Time spent: 20 minutes   Dessa Phi, DO [facility] [email or web address] 03/08/2019, 3:24 PM

## 2019-03-08 NOTE — TOC Initial Note (Signed)
Transition of Care [facility]) - Initial/Assessment Note    Patient Details  Name: Claire Hoffman MRN: [id number] Date of Birth: [birth year]  Transition of Care [facility]) CM/SW Contact:    Joaquin Courts, RN Phone Number: 03/08/2019, 3:46 PM  Clinical Narrative: Patient from [facility]. Cm spoke with facility rep who reports they can do 24 hour supervision however even prior to hospitalization the facility was questioning if patient needs a higher level of care such as SNF. Facility requested H&P, progress notes, FL2, and PT/OT evals which were all submitted for review to establish if facility can continue to provide the level of care needed by patient. Facility rep to notify CM of their decision.                   Expected Discharge Plan: Memory Care Barriers to Discharge: Continued Medical Work up   Patient Goals and CMS Choice        Expected Discharge Plan and Services Expected Discharge Plan: Memory Care   Discharge Planning Services: CM Consult   Living arrangements for the past 2 months: Assisted Living Facility(memory care) Expected Discharge Date: 03/07/19               DME Arranged: N/A DME Agency: NA       HH Arranged: NA HH Agency: NA        Prior Living Arrangements/Services Living arrangements for the past 2 months: Assisted Living Facility(memory care) Lives with:: Facility Resident Patient language and need for interpreter reviewed:: Yes        Need for Family Participation in Patient Care: Yes (Comment) Care giver support system in place?: Yes (comment)   Criminal Activity/Legal Involvement Pertinent to Current Situation/Hospitalization: No - Comment as needed  Activities of Daily Living Home Assistive Devices/Equipment: Wheelchair ADL Screening (condition at time of admission) Patient's cognitive ability adequate to safely complete daily activities?: No Is the patient deaf or have difficulty hearing?: No Does the patient have  difficulty seeing, even when wearing glasses/contacts?: No Does the patient have difficulty concentrating, remembering, or making decisions?: Yes Patient able to express need for assistance with ADLs?: No Does the patient have difficulty dressing or bathing?: Yes Independently performs ADLs?: No Communication: Dependent(has severe dementia) Is this a change from baseline?: Pre-admission baseline Dressing (OT): Dependent Is this a change from baseline?: Pre-admission baseline Grooming: Dependent Is this a change from baseline?: Pre-admission baseline Feeding: Dependent Is this a change from baseline?: Pre-admission baseline Bathing: Dependent Is this a change from baseline?: Pre-admission baseline Toileting: Dependent Is this a change from baseline?: Pre-admission baseline In/Out Bed: Dependent Is this a change from baseline?: Pre-admission baseline Walks in Home: Dependent Is this a change from baseline?: Pre-admission baseline Does the patient have difficulty walking or climbing stairs?: Yes Weakness of Legs: Both Weakness of Arms/Hands: None  Permission Sought/Granted                  Emotional Assessment Appearance:: Appears stated age     Orientation: : Fluctuating Orientation (Suspected and/or reported Sundowners)   Psych Involvement: No (comment)  Admission diagnosis:  Ecchymosis [R58] Fall, initial encounter [W19.XXXA] Anemia, unspecified type [D64.9] Patient Active Problem List   Diagnosis Date Noted  . Symptomatic anemia 03/07/2019  . Anemia 03/06/2019  . HTN (hypertension) 03/06/2019  . HLD (hyperlipidemia) 03/06/2019  . Dementia ([place]) 03/06/2019   PCP:  Sande Brothers, MD Pharmacy:  No Pharmacies Listed    Social Determinants of Health (SDOH) Interventions  Readmission Risk Interventions No flowsheet data found.

## 2019-03-08 NOTE — Progress Notes (Signed)
Patient is awake this writer attempted to feed patient and patient took 1 spoonful and became combative swinging at nurse

## 2019-03-08 NOTE — NC FL2 (Addendum)
[place] LEVEL OF CARE SCREENING TOOL     IDENTIFICATION  Patient Name: Claire Hoffman Birthdate: 05-Aug-1940 Sex: female Admission Date (Current Location): 03/05/2019  [facility] and [place] Number:  Herbalist and Address:  [facility],  [address], [place]      Provider Number: [id number]  Attending Physician Name and Address:  Dessa Phi, DO  Relative Name and Phone Number:       Current Level of Care: Hospital Recommended Level of Care: Memory Care Prior Approval Number:    Date Approved/Denied:   PASRR Number:    Discharge Plan: Other (Comment)(memory care)    Current Diagnoses: Patient Active Problem List   Diagnosis Date Noted  . Symptomatic anemia 03/07/2019  . Anemia 03/06/2019  . HTN (hypertension) 03/06/2019  . HLD (hyperlipidemia) 03/06/2019  . Dementia ([place]) 03/06/2019    Orientation RESPIRATION BLADDER Height & Weight     (disoriented x4)  Normal Incontinent Weight: 47.1 kg Height:     BEHAVIORAL SYMPTOMS/MOOD NEUROLOGICAL BOWEL NUTRITION STATUS      Continent Diet  AMBULATORY STATUS COMMUNICATION OF NEEDS Skin   Extensive Assist Verbally Normal                       Personal Care Assistance Level of Assistance  Bathing, Dressing, Total care Bathing Assistance: Maximum assistance   Dressing Assistance: Maximum assistance Total Care Assistance: Maximum assistance   Functional Limitations Info             SPECIAL CARE FACTORS FREQUENCY                       Contractures Contractures Info: Not present    Additional Factors Info                  Current Medications (03/08/2019):  This is the current hospital active medication list Current Facility-Administered Medications  Medication Dose Route Frequency Provider Last Rate Last Dose  . 0.9 %  sodium chloride infusion   Intravenous Continuous Dessa Phi, DO 100 mL/hr at 03/08/19 3545    . acetaminophen  (TYLENOL) tablet 650 mg  650 mg Oral Q6H PRN Hollice Gong, Mir Earlie Server, MD       Or  . acetaminophen (TYLENOL) suppository 650 mg  650 mg Rectal Q6H PRN Hollice Gong, Mir Earlie Server, MD      . aspirin chewable tablet 81 mg  81 mg Oral Daily Hollice Gong, Mir Mohammed, MD   81 mg at 03/07/19 6256  . atorvastatin (LIPITOR) tablet 10 mg  10 mg Oral Daily Hollice Gong, Mir Mohammed, MD   10 mg at 03/07/19 0951  . divalproex (DEPAKOTE) DR tablet 125 mg  125 mg Oral BID Tomma Rakers, MD   125 mg at 03/07/19 2333  . feeding supplement (ENSURE ENLIVE) (ENSURE ENLIVE) liquid 237 mL  237 mL Oral BID BM Dessa Phi, DO   237 mL at 03/07/19 1500  . levothyroxine (SYNTHROID) tablet 75 mcg  75 mcg Oral Q0600 Dessa Phi, DO   75 mcg at 03/08/19 0518  . multivitamin with minerals tablet 1 tablet  1 tablet Oral Daily Dessa Phi, DO      . ondansetron [facility]) tablet 4 mg  4 mg Oral Q6H PRN Hollice Gong, Mir Earlie Server, MD       Or  . ondansetron [facility]) injection 4 mg  4 mg Intravenous Q6H PRN Tomma Rakers, MD      .  polyethylene glycol (MIRALAX / GLYCOLAX) packet 17 g  17 g Oral Daily PRN Kirby CriglerIkramullah, Continental AirlinesMir Mohammed, MD      . vitamin C (ASCORBIC ACID) tablet 500 mg  500 mg Oral BID Noralee Stainhoi, Jennifer, DO   500 mg at 03/07/19 2256     Discharge Medications: Medication List    STOP taking these medications   lisinopril 40 MG tablet Commonly known as: ZESTRIL   OLANZapine 5 MG tablet Commonly known as: ZYPREXA     TAKE these medications   amLODipine 10 MG tablet Commonly known as: NORVASC Take 1 tablet (10 mg total) by mouth daily. Start taking on: March 13, 2019 What changed:   medication strength  how much to take   ascorbic acid 500 MG tablet Commonly known as: VITAMIN C Take 1 tablet (500 mg total) by mouth 2 (two) times daily.   aspirin 81 MG chewable tablet Chew 81 mg by mouth daily.   atorvastatin 10 MG tablet Commonly known as: LIPITOR Take 10 mg by mouth  daily.   divalproex 125 MG DR tablet Commonly known as: DEPAKOTE Take 125 mg by mouth 2 (two) times daily.   ferrous sulfate 325 (65 FE) MG tablet Take 1 tablet (325 mg total) by mouth daily with breakfast. Start taking on: March 13, 2019   haloperidol 5 MG tablet Commonly known as: HALDOL Take 5 mg by mouth 3 (three) times daily as needed for agitation.   levothyroxine 75 MCG tablet Commonly known as: SYNTHROID Take 1 tablet (75 mcg total) by mouth daily at 6 (six) AM. Start taking on: March 13, 2019 What changed: when to take this   multivitamin with minerals Tabs tablet Take 1 tablet by mouth daily. Start taking on: March 13, 2019   traZODone 50 MG tablet Commonly known as: DESYREL Take 50 mg by mouth at bedtime.    Relevant Imaging Results:  Relevant Lab Results:   Additional Information SSN [id number]  Armanda Heritageorres, Diana Malkina, RN

## 2019-03-08 NOTE — Progress Notes (Signed)
Patient continues to be lethargic and uncooperative when staff attempts to arouse. Unable to safely administer oral meds or obtain orthostatic VS. MD has been updated of continued lethargy. VS obtained and documented.

## 2019-03-09 LAB — CBC
HCT: 26.4 % — ABNORMAL LOW (ref 36.0–46.0)
Hemoglobin: 7.8 g/dL — ABNORMAL LOW (ref 12.0–15.0)
MCH: 29.8 pg (ref 26.0–34.0)
MCHC: 29.5 g/dL — ABNORMAL LOW (ref 30.0–36.0)
MCV: 100.8 fL — ABNORMAL HIGH (ref 80.0–100.0)
Platelets: 264 10*3/uL (ref 150–400)
RBC: 2.62 MIL/uL — ABNORMAL LOW (ref 3.87–5.11)
RDW: 14.9 % (ref 11.5–15.5)
WBC: 7.2 10*3/uL (ref 4.0–10.5)
nRBC: 0 % (ref 0.0–0.2)

## 2019-03-09 MED ORDER — AMLODIPINE BESYLATE 5 MG PO TABS
5.0000 mg | ORAL_TABLET | Freq: Every day | ORAL | Status: DC
  Administered 2019-03-09 – 2019-03-11 (×3): 5 mg via ORAL
  Filled 2019-03-09 (×3): qty 1

## 2019-03-09 MED ORDER — ENOXAPARIN SODIUM 40 MG/0.4ML ~~LOC~~ SOLN
40.0000 mg | SUBCUTANEOUS | Status: DC
  Administered 2019-03-09 – 2019-03-14 (×6): 40 mg via SUBCUTANEOUS
  Filled 2019-03-09 (×6): qty 0.4

## 2019-03-09 NOTE — Progress Notes (Signed)
Physical Therapy Treatment Patient Details Name: Claire Hoffman MRN: [id number] DOB: 1941/07/27 Today's Date: 03/09/2019    History of Present Illness Pt is a 78 y.o. female with PMH significant for dementia (currently residing in memory care unit), HTN, HLD admitted with worsening anemia. She was brought to ER due to an unwitnessed fall, and symptomatic anemia    PT Comments    Pt awake however not following simple commands.  Pt speaking however nonsensical.  Pt assisted to sitting EOB and sat briefly however attempting to return to supine so safely assisted back to bed.  Pt from ALF and may need higher level of care upon d/c.      Follow Up Recommendations  SNF;Supervision/Assistance - 24 hour     Equipment Recommendations  None recommended by PT    Recommendations for Other Services       Precautions / Restrictions Precautions Precautions: Fall Precaution Comments: mittens currently in place    Mobility  Bed Mobility Overal bed mobility: Needs Assistance Bed Mobility: Supine to Sit;Sit to Supine     Supine to sit: Max assist Sit to supine: Total assist   General bed mobility comments: pt assisted to bring legs over EOB and then pt attempting to assist with trunk upright however too weak, assist of upper and lower body for returning to bed  Transfers                 General transfer comment: pt not following commands and appeared to wish to return to supine after sitting upright EOB a few minutes  Pt also unable to state if she was dizzy with sitting.  Ambulation/Gait                 Stairs             Wheelchair Mobility    Modified Rankin (Stroke Patients Only)       Balance Overall balance assessment: Needs assistance Sitting-balance support: Feet supported;Single extremity supported Sitting balance-Leahy Scale: Poor Sitting balance - Comments: briefly able to sit upright without UE support however fatigued quickly and leaned to  left requiring postural support                                    Cognition Arousal/Alertness: Awake/alert(initially lethargic, increased arousal w/positional change)   Overall Cognitive Status: No family/caregiver present to determine baseline cognitive functioning                                 General Comments: hx of dementia, does not follow commands      Exercises      General Comments        Pertinent Vitals/Pain Pain Assessment: Faces Faces Pain Scale: No hurt Pain Intervention(s): Monitored during session;Repositioned    Home Living                      Prior Function            PT Goals (current goals can now be found in the care plan section) Progress towards PT goals: Progressing toward goals    Frequency    Min 2X/week      PT Plan Current plan remains appropriate    Co-evaluation              AM-PAC PT "6 Clicks" Mobility  Outcome Measure  Help needed turning from your back to your side while in a flat bed without using bedrails?: A Lot Help needed moving from lying on your back to sitting on the side of a flat bed without using bedrails?: A Lot Help needed moving to and from a bed to a chair (including a wheelchair)?: Total Help needed standing up from a chair using your arms (e.g., wheelchair or bedside chair)?: Total Help needed to walk in hospital room?: Total Help needed climbing 3-5 steps with a railing? : Total 6 Click Score: 8    End of Session   Activity Tolerance: Other (comment)(limited by cognition) Patient left: in bed;with call bell/phone within reach;with bed alarm set   PT Visit Diagnosis: History of falling (Z91.81);Difficulty in walking, not elsewhere classified (R26.2);Muscle weakness (generalized) (M62.81)     Time: [id number] PT Time Calculation (min) (ACUTE ONLY): 12 min  Charges:  $Therapeutic Activity: 8-22 mins                     Zenovia JarredKati Rogan Wigley, PT, DPT Acute  Rehabilitation Services Office: [phone] Pager: [phone]  Sarajane JewsLEMYRE,KATHrine E 03/09/2019, 2:29 PM

## 2019-03-09 NOTE — Progress Notes (Signed)
Orthostatic VS attempted. Patient appears to be to weak to stand. Will follow up. Lying and sitting VS completed. See flow sheet

## 2019-03-09 NOTE — Progress Notes (Signed)
Patients spouse, Edd Arbour was contacted to provide daily update. Questions denied at this time.

## 2019-03-09 NOTE — Progress Notes (Signed)
PROGRESS NOTE  Claire BaldyJacqueline Hoffman GNF:[id number] DOB: 1941/03/08 DOA: 03/05/2019 PCP: Ron ParkerBowen, Samuel, MD  HPI/Recap of past 24 hours: Claire ElliotJacqueline Pickardis a 78 y.o.femalewith medical history significant fordementiaand nonverbal currently residing in memory care unit, HTN, HLD admitted with worsening anemia. She does not have a known history of anemia, however in early July had Hb of 10. She was brought to ER tonight due to an unwitnessed fall, with history taken from ER provider.Patient was admitted for further work-up of symptomatic anemia. Hgb has remained stable, without evidence of acute bleeding.  03/09/19: Patient was seen and examined at her bedside.  She is alert but confused in a state of advanced dementia.  Unable to obtain reliable history.  No complaints.   Assessment/Plan: Principal Problem:   Anemia Active Problems:   HTN (hypertension)   HLD (hyperlipidemia)   Dementia ([facility])   Symptomatic anemia  Fall likely secondary to orthostasis Positive orthostatic vital signs Urine analysis negative PT assessment recommended SNF with 24-hour assistance Fall precautions Continue IV fluid hydration normal saline at 100 cc/h  Symptomatic anemia Negative FOBT No sign of significant iron deficiency Baseline hemoglobin appears to be, 3 weeks ago Hemoglobin today 7.8 No abdominal pain or tenderness on palpation on exam Continue to monitor repeat H&H in the morning  if hemoglobin continues to trend down will obtain CT abdomen and pelvis  Dementia with behavioral disturbance Reorient as needed  Uncontrolled hypertension Resume antihypertensive Norvasc Continue to monitor vital signs  Hyperlipidemia Continue Lipitor  Seizure disorder No reported seizure activity Continue Depakote  Hypothyroidism Continue Synthroid   DVT prophylaxis: SCD/subcu Lovenox daily Code Status: Full Family Communication: None  Disposition Plan: Pending improvement in orthostatic VS and  somnolence. Back to memory care unit on discharge.   Consultants:   None  Procedures:   None       Objective: Vitals:   03/08/19 0450 03/08/19 1400 03/08/19 1900 03/09/19 0533  BP: 135/88 (!) 155/83 (!) 154/65 (!) 164/85  Pulse: 81 86 76 83  Resp: 17 16 18 18   Temp: 97.8 F (36.6 C) 98.1 F (36.7 C) 98.7 F (37.1 C) 97.8 F (36.6 C)  TempSrc: Axillary Axillary Axillary Oral  SpO2: 100% 100% 100%   Weight:        Intake/Output Summary (Last 24 hours) at 03/09/2019 1227 Last data filed at 03/09/2019 1015 Gross per 24 hour  Intake 1013.28 ml  Output 1700 ml  Net -686.72 ml   Filed Weights   03/06/19 0100  Weight: 47.1 kg    Exam:  . General: 78 y.o. year-old female well developed well nourished in no acute distress.  Alert and confused in a state of advanced dementia. . Cardiovascular: Regular rate and rhythm with no rubs or gallops.  No thyromegaly or JVD noted.   Marland Kitchen. Respiratory: Clear to auscultation with no wheezes or rales. Good inspiratory effort. . Abdomen: Soft nontender nondistended with normal bowel sounds x4 quadrants. . Musculoskeletal: Trace lower extremity edema. 2/4 pulses in all 4 extremities. Marland Kitchen. Psychiatry: Mood is appropriate for condition and setting   Data Reviewed: CBC: Recent Labs  Lab 03/05/19 2032 03/06/19 0351 03/07/19 0323 03/08/19 0348 03/09/19 0358  WBC 8.0 6.4 5.8 6.9 7.2  NEUTROABS 5.9  --   --   --   --   HGB 8.1* 7.7* 8.2* 7.7* 7.8*  HCT 25.9* 26.1* 27.3* 25.9* 26.4*  MCV 100.0 102.4* 99.3 100.8* 100.8*  PLT 249 256 281 257 264   Basic Metabolic Panel: Recent  Labs  Lab 03/05/19 2032 03/08/19 0348  NA 139 140  K 4.2 3.8  CL 105 107  CO2 26 26  GLUCOSE 104* 87  BUN 19 15  CREATININE 0.60 0.57  CALCIUM 8.6* 8.3*   GFR: CrCl cannot be calculated (Unknown ideal weight.). Liver Function Tests: Recent Labs  Lab 03/06/19 0351  AST 28  ALT 35  ALKPHOS 65  BILITOT 1.2  PROT 5.9*  ALBUMIN 3.1*   No results  for input(s): LIPASE, AMYLASE in the last 168 hours. No results for input(s): AMMONIA in the last 168 hours. Coagulation Profile: Recent Labs  Lab 03/05/19 2248  INR 1.0   Cardiac Enzymes: No results for input(s): CKTOTAL, CKMB, CKMBINDEX, TROPONINI in the last 168 hours. BNP (last 3 results) No results for input(s): PROBNP in the last 8760 hours. HbA1C: No results for input(s): HGBA1C in the last 72 hours. CBG: No results for input(s): GLUCAP in the last 168 hours. Lipid Profile: No results for input(s): CHOL, HDL, LDLCALC, TRIG, CHOLHDL, LDLDIRECT in the last 72 hours. Thyroid Function Tests: Recent Labs    03/08/19 0348  TSH 5.133*  FREET4 1.07   Anemia Panel: No results for input(s): VITAMINB12, FOLATE, FERRITIN, TIBC, IRON, RETICCTPCT in the last 72 hours. Urine analysis:    Component Value Date/Time   COLORURINE YELLOW 03/05/2019 1904   APPEARANCEUR CLEAR 03/05/2019 1904   LABSPEC 1.015 03/05/2019 1904   PHURINE 7.0 03/05/2019 1904   GLUCOSEU NEGATIVE 03/05/2019 [place] NEGATIVE 03/05/2019 [place] NEGATIVE 03/05/2019 1904   KETONESUR 5 (A) 03/05/2019 1904   PROTEINUR NEGATIVE 03/05/2019 1904   NITRITE NEGATIVE 03/05/2019 1904   LEUKOCYTESUR NEGATIVE 03/05/2019 1904   Sepsis Labs: @LABRCNTIP (procalcitonin:4,lacticidven:4)  ) Recent Results (from the past 240 hour(s))  Urine culture     Status: None   Collection Time: 03/05/19  7:05 PM   Specimen: Urine, Clean Catch  Result Value Ref Range Status   Specimen Description   Final    URINE, CLEAN CATCH Performed at [facility], [place] [address] [place], [place] [address]    Special Requests   Final    NONE Performed at [facility], [place] [address] [place], [place] [address]    Culture   Final    NO GROWTH Performed at [facility], [place] [address], [place], [place] [address]    Report Status 03/07/2019 FINAL  Final  SARS CORONAVIRUS 2 Nasal  Swab Aptima Multi Swab     Status: None   Collection Time: 03/06/19 12:23 AM   Specimen: Aptima Multi Swab; Nasal Swab  Result Value Ref Range Status   SARS Coronavirus 2 NEGATIVE NEGATIVE Final    Comment: (NOTE) SARS-CoV-2 target nucleic acids are NOT DETECTED. The SARS-CoV-2 RNA is generally detectable in upper and lower respiratory specimens during the acute phase of infection. Negative results do not preclude SARS-CoV-2 infection, do not rule out co-infections with other pathogens, and should not be used as the sole basis for treatment or other patient management decisions. Negative results must be combined with clinical observations, patient history, and epidemiological information. The expected result is Negative. Fact Sheet for Patients: SugarRoll.be Fact Sheet for Healthcare Providers: https://www.woods-mathews.com/ This test is not yet approved or cleared by the [place] FDA and  has been authorized for detection and/or diagnosis of SARS-CoV-2 by FDA under an Emergency Use Authorization (EUA). This EUA will remain  in effect (meaning this test can be used) for  the duration of the COVID-19 declaration under Section 56 4(b)(1) of the Act, 21 U.S.C. section 360bbb-3(b)(1), unless the authorization is terminated or revoked sooner. Performed at [facility], [address] [place], [place] [address]       Studies: No results found.  Scheduled Meds: . aspirin  81 mg Oral Daily  . atorvastatin  10 mg Oral Daily  . divalproex  125 mg Oral BID  . feeding supplement (ENSURE ENLIVE)  237 mL Oral BID BM  . levothyroxine  75 mcg Oral Q0600  . multivitamin with minerals  1 tablet Oral Daily  . vitamin C  500 mg Oral BID    Continuous Infusions: . sodium chloride 100 mL/hr at 03/09/19 0507     LOS: 2 days     Darlin Droparole N Shamya Macfadden, MD [facility] Pager [phone]  If 7PM-7AM, please contact night-coverage  [email or web address] Password [email or web address] 03/09/2019, 12:27 PM

## 2019-03-10 LAB — CBC
HCT: 26.8 % — ABNORMAL LOW (ref 36.0–46.0)
Hemoglobin: 8.1 g/dL — ABNORMAL LOW (ref 12.0–15.0)
MCH: 30.5 pg (ref 26.0–34.0)
MCHC: 30.2 g/dL (ref 30.0–36.0)
MCV: 100.8 fL — ABNORMAL HIGH (ref 80.0–100.0)
Platelets: 262 10*3/uL (ref 150–400)
RBC: 2.66 MIL/uL — ABNORMAL LOW (ref 3.87–5.11)
RDW: 15 % (ref 11.5–15.5)
WBC: 9.1 10*3/uL (ref 4.0–10.5)
nRBC: 0 % (ref 0.0–0.2)

## 2019-03-10 NOTE — Progress Notes (Signed)
PROGRESS NOTE  Claire Hoffman POE:[id number] DOB: 1941-06-18 DOA: 03/05/2019 PCP: Sande Brothers, MD  HPI/Recap of past 24 hours: Claire Hoffman a 78 y.o.femalewith medical history significant fordementiaand nonverbal currently residing in memory care unit, HTN, HLD admitted with worsening anemia. She does not have a known history of anemia, however in early July had Hb of 10. She was brought to ER tonight due to an unwitnessed fall, with history taken from ER provider.Patient was admitted for further work-up of symptomatic anemia. Hgb has remained stable, without evidence of acute bleeding.  03/10/19: Patient was seen and examined at her bedside.  No acute events overnight.  She is somnolent but easily arousable to voices.  Denies abdominal pain.  Hemoglobin slightly improving from 7.8 to 8.1 this morning.   Assessment/Plan: Principal Problem:   Anemia Active Problems:   HTN (hypertension)   HLD (hyperlipidemia)   Dementia (HCC)   Symptomatic anemia  Fall likely secondary to orthostasis Positive orthostatic vital signs Urine analysis negative PT assessment recommended SNF with 24-hour assistance Fall precautions Continue IV fluid hydration normal saline at 100 cc/h Repeat orthostatic vital signs CSW consulted for placement  Symptomatic anemia Negative FOBT No sign of significant iron deficiency Baseline hemoglobin appears to be, 3 weeks ago Hemoglobin today improved 8.1 on 03/10/2019 from 7.8 No abdominal pain or tenderness on palpation on exam Continue to monitor repeat H&H in the morning  Dementia with behavioral disturbance Reorient as needed  Uncontrolled hypertension, resolved Blood pressure is currently at goal Resume antihypertensive Norvasc Continue to monitor vital signs  Hyperlipidemia Continue Lipitor  Seizure disorder No reported seizure activity Continue Depakote  Hypothyroidism Continue Synthroid   DVT prophylaxis: SCD/subcu Lovenox  daily Code Status: Full Family Communication: None  Disposition Plan:  Pending clinical improvement possibly tomorrow 03/11/2019 if bed is available at SNF.  Consultants:   None  Procedures:   None       Objective: Vitals:   03/09/19 1729 03/09/19 2030 03/10/19 0607 03/10/19 1235  BP: (!) 150/85 (!) 145/76 (!) 143/76 139/75  Pulse: 81 86 65 68  Resp:  18 18 17   Temp:  98.7 F (37.1 C) 98.2 F (36.8 C) 98.7 F (37.1 C)  TempSrc:  Oral Oral Oral  SpO2:  98% 97% 99%  Weight:      Height:        Intake/Output Summary (Last 24 hours) at 03/10/2019 1345 Last data filed at 03/10/2019 0700 Gross per 24 hour  Intake 2150 ml  Output 2400 ml  Net -250 ml   Filed Weights   03/06/19 0100 03/09/19 1500  Weight: 47.1 kg 47 kg    Exam:  . General: 78 y.o. year-old female well-developed well-nourished no acute distress.  Somnolent but easily arousable to voices.   . Cardiovascular: Regular rate and rhythm no rubs or gallops no JVD or thyromegaly.   Marland Kitchen Respiratory: Clear to auscultation no wheezes or rales.  Poor inspiratory effort.   . Abdomen: Soft nontender nondistended normal bowel sounds present.  . Musculoskeletal: Trace lower extremity edema.  2 out of 4 pulses in all 4 extremities.  Marland Kitchen Psychiatry: Mood is appropriate for condition and setting.   Data Reviewed: CBC: Recent Labs  Lab 03/05/19 2032 03/06/19 0351 03/07/19 0323 03/08/19 0348 03/09/19 0358 03/10/19 0656  WBC 8.0 6.4 5.8 6.9 7.2 9.1  NEUTROABS 5.9  --   --   --   --   --   HGB 8.1* 7.7* 8.2* 7.7* 7.8* 8.1*  HCT  25.9* 26.1* 27.3* 25.9* 26.4* 26.8*  MCV 100.0 102.4* 99.3 100.8* 100.8* 100.8*  PLT 249 256 281 257 264 262   Basic Metabolic Panel: Recent Labs  Lab 03/05/19 2032 03/08/19 0348  NA 139 140  K 4.2 3.8  CL 105 107  CO2 26 26  GLUCOSE 104* 87  BUN 19 15  CREATININE 0.60 0.57  CALCIUM 8.6* 8.3*   GFR: Estimated Creatinine Clearance: 43 mL/min (by C-G formula based on SCr of 0.57  mg/dL). Liver Function Tests: Recent Labs  Lab 03/06/19 0351  AST 28  ALT 35  ALKPHOS 65  BILITOT 1.2  PROT 5.9*  ALBUMIN 3.1*   No results for input(s): LIPASE, AMYLASE in the last 168 hours. No results for input(s): AMMONIA in the last 168 hours. Coagulation Profile: Recent Labs  Lab 03/05/19 2248  INR 1.0   Cardiac Enzymes: No results for input(s): CKTOTAL, CKMB, CKMBINDEX, TROPONINI in the last 168 hours. BNP (last 3 results) No results for input(s): PROBNP in the last 8760 hours. HbA1C: No results for input(s): HGBA1C in the last 72 hours. CBG: No results for input(s): GLUCAP in the last 168 hours. Lipid Profile: No results for input(s): CHOL, HDL, LDLCALC, TRIG, CHOLHDL, LDLDIRECT in the last 72 hours. Thyroid Function Tests: Recent Labs    03/08/19 0348  TSH 5.133*  FREET4 1.07   Anemia Panel: No results for input(s): VITAMINB12, FOLATE, FERRITIN, TIBC, IRON, RETICCTPCT in the last 72 hours. Urine analysis:    Component Value Date/Time   COLORURINE YELLOW 03/05/2019 1904   APPEARANCEUR CLEAR 03/05/2019 1904   LABSPEC 1.015 03/05/2019 1904   PHURINE 7.0 03/05/2019 1904   GLUCOSEU NEGATIVE 03/05/2019 1904   HGBUR NEGATIVE 03/05/2019 1904   BILIRUBINUR NEGATIVE 03/05/2019 1904   KETONESUR 5 (A) 03/05/2019 1904   PROTEINUR NEGATIVE 03/05/2019 1904   NITRITE NEGATIVE 03/05/2019 1904   LEUKOCYTESUR NEGATIVE 03/05/2019 1904   Sepsis Labs: @LABRCNTIP (procalcitonin:4,lacticidven:4)  ) Recent Results (from the past 240 hour(s))  Urine culture     Status: None   Collection Time: 03/05/19  7:05 PM   Specimen: Urine, Clean Catch  Result Value Ref Range Status   Specimen Description   Final    URINE, CLEAN CATCH Performed at [facility], [address] [place], [place] [address]    Special Requests   Final    NONE Performed at [facility], [address] [place], [place] [address]    Culture   Final    NO  GROWTH Performed at [facility], [address] [place], [place] [address]    Report Status 03/07/2019 FINAL  Final  SARS CORONAVIRUS 2 Nasal Swab Aptima Multi Swab     Status: None   Collection Time: 03/06/19 12:23 AM   Specimen: Aptima Multi Swab; Nasal Swab  Result Value Ref Range Status   SARS Coronavirus 2 NEGATIVE NEGATIVE Final    Comment: (NOTE) SARS-CoV-2 target nucleic acids are NOT DETECTED. The SARS-CoV-2 RNA is generally detectable in upper and lower respiratory specimens during the acute phase of infection. Negative results do not preclude SARS-CoV-2 infection, do not rule out co-infections with other pathogens, and should not be used as the sole basis for treatment or other patient management decisions. Negative results must be combined with clinical observations, patient history, and epidemiological information. The expected result is Negative. Fact Sheet for Patients: HairSlick.nohttps://www.fda.gov/media/138098/download Fact Sheet for Healthcare Providers: quierodirigir.comhttps://www.fda.gov/media/138095/download This test is not yet approved or cleared by the [place] FDA and  has been authorized for detection and/or diagnosis of SARS-CoV-2 by FDA under an Emergency Use Authorization (EUA). This EUA will remain  in effect (meaning this test can be used) for the duration of the COVID-19 declaration under Section 56 4(b)(1) of the Act, 21 U.S.C. section 360bbb-3(b)(1), unless the authorization is terminated or revoked sooner. Performed at [facility], [address], [place], [place] [address]       Studies: No results found.  Scheduled Meds: . amLODipine  5 mg Oral Daily  . aspirin  81 mg Oral Daily  . atorvastatin  10 mg Oral Daily  . divalproex  125 mg Oral BID  . enoxaparin (LOVENOX) injection  40 mg Subcutaneous Q24H  . feeding supplement (ENSURE ENLIVE)  237 mL Oral BID BM  . levothyroxine  75 mcg Oral Q0600  . multivitamin with minerals  1 tablet Oral  Daily  . vitamin C  500 mg Oral BID    Continuous Infusions: . sodium chloride 100 mL/hr at 03/10/19 1307     LOS: 3 days     Darlin Drop, MD [facility] Pager [phone]  If 7PM-7AM, please contact night-coverage [email or web address] Password [email or web address] 03/10/2019, 1:45 PM    PROGRESS NOTE  Carolena Fairbank OZH:[id number] DOB: January 10, 1941 DOA: 03/05/2019 PCP: Ron Parker, MD  HPI/Recap of past 24 hours: Randall Rampersad a 78 y.o.femalewith medical history significant fordementiaand nonverbal currently residing in memory care unit, HTN, HLD admitted with worsening anemia. She does not have a known history of anemia, however in early July had Hb of 10. She was brought to ER tonight due to an unwitnessed fall, with history taken from ER provider.Patient was admitted for further work-up of symptomatic anemia. Hgb has remained stable, without evidence of acute bleeding.  03/09/19: Patient was seen and examined at her bedside.  She is alert but confused in a state of advanced dementia.  Unable to obtain reliable history.  No complaints.   Assessment/Plan: Principal Problem:   Anemia Active Problems:   HTN (hypertension)   HLD (hyperlipidemia)   Dementia (HCC)   Symptomatic anemia  Fall likely secondary to orthostasis Positive orthostatic vital signs Urine analysis negative PT assessment recommended SNF with 24-hour assistance Fall precautions Continue IV fluid hydration normal saline at 100 cc/h  Symptomatic anemia Negative FOBT No sign of significant iron deficiency Baseline hemoglobin appears to be, 3 weeks ago Hemoglobin today 7.8 No abdominal pain or tenderness on palpation on exam Continue to monitor repeat H&H in the morning  if hemoglobin continues to trend down will obtain CT abdomen and pelvis  Dementia with behavioral disturbance Reorient as needed  Uncontrolled hypertension Resume antihypertensive Norvasc Continue to monitor vital signs   Hyperlipidemia Continue Lipitor  Seizure disorder No reported seizure activity Continue Depakote  Hypothyroidism Continue Synthroid   DVT prophylaxis: SCD/subcu Lovenox daily Code Status: Full Family Communication: None  Disposition Plan: Pending improvement in orthostatic VS and somnolence. Back to memory care unit on discharge.   Consultants:   None  Procedures:   None       Objective: Vitals:   03/09/19 1729 03/09/19 2030 03/10/19 0607 03/10/19 1235  BP: (!) 150/85 (!) 145/76 (!) 143/76 139/75  Pulse: 81 86 65 68  Resp:  18 18 17   Temp:  98.7 F (37.1 C) 98.2 F (36.8 C) 98.7 F (37.1 C)  TempSrc:  Oral Oral Oral  SpO2:  98% 97% 99%  Weight:      Height:  Intake/Output Summary (Last 24 hours) at 03/10/2019 1346 Last data filed at 03/10/2019 0700 Gross per 24 hour  Intake 2150 ml  Output 2400 ml  Net -250 ml   Filed Weights   03/06/19 0100 03/09/19 1500  Weight: 47.1 kg 47 kg    Exam:  . General: 78 y.o. year-old female well developed well nourished in no acute distress.  Alert and confused in a state of advanced dementia. . Cardiovascular: Regular rate and rhythm with no rubs or gallops.  No thyromegaly or JVD noted.   Marland Kitchen. Respiratory: Clear to auscultation with no wheezes or rales. Good inspiratory effort. . Abdomen: Soft nontender nondistended with normal bowel sounds x4 quadrants. . Musculoskeletal: Trace lower extremity edema. 2/4 pulses in all 4 extremities. Marland Kitchen. Psychiatry: Mood is appropriate for condition and setting   Data Reviewed: CBC: Recent Labs  Lab 03/05/19 2032 03/06/19 0351 03/07/19 0323 03/08/19 0348 03/09/19 0358 03/10/19 0656  WBC 8.0 6.4 5.8 6.9 7.2 9.1  NEUTROABS 5.9  --   --   --   --   --   HGB 8.1* 7.7* 8.2* 7.7* 7.8* 8.1*  HCT 25.9* 26.1* 27.3* 25.9* 26.4* 26.8*  MCV 100.0 102.4* 99.3 100.8* 100.8* 100.8*  PLT 249 256 281 257 264 262   Basic Metabolic Panel: Recent Labs  Lab 03/05/19 2032 03/08/19  0348  NA 139 140  K 4.2 3.8  CL 105 107  CO2 26 26  GLUCOSE 104* 87  BUN 19 15  CREATININE 0.60 0.57  CALCIUM 8.6* 8.3*   GFR: Estimated Creatinine Clearance: 43 mL/min (by C-G formula based on SCr of 0.57 mg/dL). Liver Function Tests: Recent Labs  Lab 03/06/19 0351  AST 28  ALT 35  ALKPHOS 65  BILITOT 1.2  PROT 5.9*  ALBUMIN 3.1*   No results for input(s): LIPASE, AMYLASE in the last 168 hours. No results for input(s): AMMONIA in the last 168 hours. Coagulation Profile: Recent Labs  Lab 03/05/19 2248  INR 1.0   Cardiac Enzymes: No results for input(s): CKTOTAL, CKMB, CKMBINDEX, TROPONINI in the last 168 hours. BNP (last 3 results) No results for input(s): PROBNP in the last 8760 hours. HbA1C: No results for input(s): HGBA1C in the last 72 hours. CBG: No results for input(s): GLUCAP in the last 168 hours. Lipid Profile: No results for input(s): CHOL, HDL, LDLCALC, TRIG, CHOLHDL, LDLDIRECT in the last 72 hours. Thyroid Function Tests: Recent Labs    03/08/19 0348  TSH 5.133*  FREET4 1.07   Anemia Panel: No results for input(s): VITAMINB12, FOLATE, FERRITIN, TIBC, IRON, RETICCTPCT in the last 72 hours. Urine analysis:    Component Value Date/Time   COLORURINE YELLOW 03/05/2019 1904   APPEARANCEUR CLEAR 03/05/2019 1904   LABSPEC 1.015 03/05/2019 1904   PHURINE 7.0 03/05/2019 1904   GLUCOSEU NEGATIVE 03/05/2019 1904   HGBUR NEGATIVE 03/05/2019 1904   BILIRUBINUR NEGATIVE 03/05/2019 1904   KETONESUR 5 (A) 03/05/2019 1904   PROTEINUR NEGATIVE 03/05/2019 1904   NITRITE NEGATIVE 03/05/2019 1904   LEUKOCYTESUR NEGATIVE 03/05/2019 1904   Sepsis Labs: @LABRCNTIP (procalcitonin:4,lacticidven:4)  ) Recent Results (from the past 240 hour(s))  Urine culture     Status: None   Collection Time: 03/05/19  7:05 PM   Specimen: Urine, Clean Catch  Result Value Ref Range Status   Specimen Description   Final    URINE, CLEAN CATCH Performed at [facility], [address] [place], [place] [address]    Special Requests   Final  NONE Performed at [facility], [address] [place], [place] [address]    Culture   Final    NO GROWTH Performed at [facility], [address] [place], [place] [address]    Report Status 03/07/2019 FINAL  Final  SARS CORONAVIRUS 2 Nasal Swab Aptima Multi Swab     Status: None   Collection Time: 03/06/19 12:23 AM   Specimen: Aptima Multi Swab; Nasal Swab  Result Value Ref Range Status   SARS Coronavirus 2 NEGATIVE NEGATIVE Final    Comment: (NOTE) SARS-CoV-2 target nucleic acids are NOT DETECTED. The SARS-CoV-2 RNA is generally detectable in upper and lower respiratory specimens during the acute phase of infection. Negative results do not preclude SARS-CoV-2 infection, do not rule out co-infections with other pathogens, and should not be used as the sole basis for treatment or other patient management decisions. Negative results must be combined with clinical observations, patient history, and epidemiological information. The expected result is Negative. Fact Sheet for Patients: HairSlick.nohttps://www.fda.gov/media/138098/download Fact Sheet for Healthcare Providers: quierodirigir.comhttps://www.fda.gov/media/138095/download This test is not yet approved or cleared by the [place] FDA and  has been authorized for detection and/or diagnosis of SARS-CoV-2 by FDA under an Emergency Use Authorization (EUA). This EUA will remain  in effect (meaning this test can be used) for the duration of the COVID-19 declaration under Section 56 4(b)(1) of the Act, 21 U.S.C. section 360bbb-3(b)(1), unless the authorization is terminated or revoked sooner. Performed at [facility], [address] [place], [place] [address]       Studies: No results found.  Scheduled Meds: . amLODipine  5 mg Oral Daily  . aspirin  81 mg Oral Daily  . atorvastatin  10 mg Oral Daily  . divalproex   125 mg Oral BID  . enoxaparin (LOVENOX) injection  40 mg Subcutaneous Q24H  . feeding supplement (ENSURE ENLIVE)  237 mL Oral BID BM  . levothyroxine  75 mcg Oral Q0600  . multivitamin with minerals  1 tablet Oral Daily  . vitamin C  500 mg Oral BID    Continuous Infusions: . sodium chloride 100 mL/hr at 03/10/19 1307     LOS: 3 days     Darlin Droparole N Zunairah Devers, MD [facility] Pager [phone]  If 7PM-7AM, please contact night-coverage [email or web address] Password [email or web address] 03/10/2019, 1:46 PM

## 2019-03-10 NOTE — Care Management Important Message (Signed)
Important Message  Patient Details IM Letter given to Cookie McGibboney RN to present to the Patient Name: Claire Hoffman MRN: [id number] Date of Birth: 1940/10/07   Medicare Important Message Given:  Yes     Kerin Salen 03/10/2019, 10:20 AM

## 2019-03-11 LAB — CBC
HCT: 26.6 % — ABNORMAL LOW (ref 36.0–46.0)
Hemoglobin: 8.3 g/dL — ABNORMAL LOW (ref 12.0–15.0)
MCH: 30.9 pg (ref 26.0–34.0)
MCHC: 31.2 g/dL (ref 30.0–36.0)
MCV: 98.9 fL (ref 80.0–100.0)
Platelets: 242 10*3/uL (ref 150–400)
RBC: 2.69 MIL/uL — ABNORMAL LOW (ref 3.87–5.11)
RDW: 15 % (ref 11.5–15.5)
WBC: 7.9 10*3/uL (ref 4.0–10.5)
nRBC: 0 % (ref 0.0–0.2)

## 2019-03-11 LAB — GLUCOSE, CAPILLARY: Glucose-Capillary: 131 mg/dL — ABNORMAL HIGH (ref 70–99)

## 2019-03-11 MED ORDER — AMLODIPINE BESYLATE 10 MG PO TABS
10.0000 mg | ORAL_TABLET | Freq: Every day | ORAL | Status: DC
  Administered 2019-03-12 – 2019-03-14 (×3): 10 mg via ORAL
  Filled 2019-03-11 (×3): qty 1

## 2019-03-11 MED ORDER — FERROUS SULFATE 325 (65 FE) MG PO TABS
325.0000 mg | ORAL_TABLET | Freq: Every day | ORAL | Status: DC
  Administered 2019-03-11 – 2019-03-14 (×4): 325 mg via ORAL
  Filled 2019-03-11 (×4): qty 1

## 2019-03-11 MED ORDER — AMLODIPINE BESYLATE 5 MG PO TABS
5.0000 mg | ORAL_TABLET | Freq: Once | ORAL | Status: AC
  Administered 2019-03-11: 5 mg via ORAL

## 2019-03-11 NOTE — Plan of Care (Signed)

## 2019-03-11 NOTE — Progress Notes (Signed)
PROGRESS NOTE  Claire Hoffman TOI:[id number] DOB: [birth year] DOA: 03/05/2019 PCP: Sande Brothers, MD  HPI/Recap of past 24 hours: Claire Hoffman a 78 y.o.femalewith medical history significant fordementiaand nonverbal currently residing in memory care unit, HTN, HLD admitted with worsening anemia. She does not have a known history of anemia, however in early July had Hb of 10. She was brought to ER tonight due to an unwitnessed fall, with history taken from ER provider.Patient was admitted for further work-up of symptomatic anemia. Hgb has remained stable, without evidence of acute bleeding.  03/11/19: Patient seen and examined at her bedside this morning.  Still very somnolent and minimally interactive, will obtain a CBG.  Uncontrolled hypertension, Norvasc increased to 10 mg daily.   Assessment/Plan: Principal Problem:   Anemia Active Problems:   HTN (hypertension)   HLD (hyperlipidemia)   Dementia (HCC)   Symptomatic anemia  Fall likely secondary to orthostasis Positive orthostatic vital signs Urine analysis negative PT assessment recommended SNF with 24-hour assistance Fall precautions Continue IV fluid hydration normal saline at 100 cc/h Repeat orthostatic vital signs CSW consulted for placement  Uncontrolled hypertension Blood pressure consistently elevated since holding of lisinopril Increase Norvasc home dose from 5 mg daily to 10 mg daily Continue to closely monitor vital signs Avoid diuretics for now due to orthostasis  Lethargy, unclear etiology Possibly related to overall physical debility Continue to monitor electrolytes, CBG, and replete as indicated Oral supplement Continue PT  Iron deficiency anemia Negative FOBT Iron studies positive for iron deficiency on 03/05/2019 Start ferrous sulfate 325 mg daily Hemoglobin improving 8.3 on 03/11/2019 from 8.1 yesterday. No sign of overt bleeding No abdominal pain or nausea  Dementia with behavioral  disturbance Reorient as needed  Hyperlipidemia Continue Lipitor  Seizure disorder No reported seizure activity Continue Depakote  Hypothyroidism Continue Synthroid   DVT prophylaxis: SCD/subcu Lovenox daily Code Status: Full Family Communication: None  Disposition Plan:  Possible discharge to memory care tomorrow 03/12/2019 once blood pressure is better controlled and lethargy has improved.  Consultants:   None  Procedures:   None       Objective: Vitals:   03/10/19 0607 03/10/19 1235 03/10/19 2141 03/11/19 0622  BP: (!) 143/76 139/75 (!) 175/99 (!) 142/82  Pulse: 65 68 89 72  Resp: 18 17 14 16   Temp: 98.2 F (36.8 C) 98.7 F (37.1 C) 98.1 F (36.7 C) 97.8 F (36.6 C)  TempSrc: Oral Oral Oral Oral  SpO2: 97% 99% 99% 99%  Weight:      Height:        Intake/Output Summary (Last 24 hours) at 03/11/2019 1040 Last data filed at 03/11/2019 0900 Gross per 24 hour  Intake 2113.49 ml  Output 1350 ml  Net 763.49 ml   Filed Weights   03/06/19 0100 03/09/19 1500  Weight: 47.1 kg 47 kg    Exam:  . General: 78 y.o. year-old female well-developed well-nourished in no acute distress.  Somnolent but arousable to voices. . Cardiovascular: Regular rate and rhythm no rubs or gallops no JVD without megaly.   Marland Kitchen Respiratory: Clear to auscultation no wheezes or rales. Poor inspiratory effort.   . Abdomen: Soft Nontender Nondistended Normal Bowel Sounds Present.  . Musculoskeletal: Trace lower extremity edema.  2 out of 4 pulses in all 4 extremities. Marland Kitchen Psychiatry: Mood is appropriate for condition and setting.   Data Reviewed: CBC: Recent Labs  Lab 03/05/19 2032  03/07/19 0323 03/08/19 0348 03/09/19 0358 03/10/19 0656 03/11/19 0629  WBC 8.0   < >  5.8 6.9 7.2 9.1 7.9  NEUTROABS 5.9  --   --   --   --   --   --   HGB 8.1*   < > 8.2* 7.7* 7.8* 8.1* 8.3*  HCT 25.9*   < > 27.3* 25.9* 26.4* 26.8* 26.6*  MCV 100.0   < > 99.3 100.8* 100.8* 100.8* 98.9  PLT 249   < >  281 257 264 262 242   < > = values in this interval not displayed.   Basic Metabolic Panel: Recent Labs  Lab 03/05/19 2032 03/08/19 0348  NA 139 140  K 4.2 3.8  CL 105 107  CO2 26 26  GLUCOSE 104* 87  BUN 19 15  CREATININE 0.60 0.57  CALCIUM 8.6* 8.3*   GFR: Estimated Creatinine Clearance: 43 mL/min (by C-G formula based on SCr of 0.57 mg/dL). Liver Function Tests: Recent Labs  Lab 03/06/19 0351  AST 28  ALT 35  ALKPHOS 65  BILITOT 1.2  PROT 5.9*  ALBUMIN 3.1*   No results for input(s): LIPASE, AMYLASE in the last 168 hours. No results for input(s): AMMONIA in the last 168 hours. Coagulation Profile: Recent Labs  Lab 03/05/19 2248  INR 1.0   Cardiac Enzymes: No results for input(s): CKTOTAL, CKMB, CKMBINDEX, TROPONINI in the last 168 hours. BNP (last 3 results) No results for input(s): PROBNP in the last 8760 hours. HbA1C: No results for input(s): HGBA1C in the last 72 hours. CBG: No results for input(s): GLUCAP in the last 168 hours. Lipid Profile: No results for input(s): CHOL, HDL, LDLCALC, TRIG, CHOLHDL, LDLDIRECT in the last 72 hours. Thyroid Function Tests: No results for input(s): TSH, T4TOTAL, FREET4, T3FREE, THYROIDAB in the last 72 hours. Anemia Panel: No results for input(s): VITAMINB12, FOLATE, FERRITIN, TIBC, IRON, RETICCTPCT in the last 72 hours. Urine analysis:    Component Value Date/Time   COLORURINE YELLOW 03/05/2019 1904   APPEARANCEUR CLEAR 03/05/2019 1904   LABSPEC 1.015 03/05/2019 1904   PHURINE 7.0 03/05/2019 1904   GLUCOSEU NEGATIVE 03/05/2019 1904   HGBUR NEGATIVE 03/05/2019 1904   BILIRUBINUR NEGATIVE 03/05/2019 1904   KETONESUR 5 (A) 03/05/2019 1904   PROTEINUR NEGATIVE 03/05/2019 1904   NITRITE NEGATIVE 03/05/2019 1904   LEUKOCYTESUR NEGATIVE 03/05/2019 1904   Sepsis Labs: @LABRCNTIP (procalcitonin:4,lacticidven:4)  ) Recent Results (from the past 240 hour(s))  Urine culture     Status: None   Collection Time:  03/05/19  7:05 PM   Specimen: Urine, Clean Catch  Result Value Ref Range Status   Specimen Description   Final    URINE, CLEAN CATCH Performed at [facility], [address] [place], [place] [address]    Special Requests   Final    NONE Performed at [facility], [address] [place], [place] [address]    Culture   Final    NO GROWTH Performed at [facility], [address] [place], [place] [address]    Report Status 03/07/2019 FINAL  Final  SARS CORONAVIRUS 2 Nasal Swab Aptima Multi Swab     Status: None   Collection Time: 03/06/19 12:23 AM   Specimen: Aptima Multi Swab; Nasal Swab  Result Value Ref Range Status   SARS Coronavirus 2 NEGATIVE NEGATIVE Final    Comment: (NOTE) SARS-CoV-2 target nucleic acids are NOT DETECTED. The SARS-CoV-2 RNA is generally detectable in upper and lower respiratory specimens during the acute phase of infection. Negative results do not preclude SARS-CoV-2 infection, do not  rule out co-infections with other pathogens, and should not be used as the sole basis for treatment or other patient management decisions. Negative results must be combined with clinical observations, patient history, and epidemiological information. The expected result is Negative. Fact Sheet for Patients: HairSlick.nohttps://www.fda.gov/media/138098/download Fact Sheet for Healthcare Providers: quierodirigir.comhttps://www.fda.gov/media/138095/download This test is not yet approved or cleared by the [place] FDA and  has been authorized for detection and/or diagnosis of SARS-CoV-2 by FDA under an Emergency Use Authorization (EUA). This EUA will remain  in effect (meaning this test can be used) for the duration of the COVID-19 declaration under Section 56 4(b)(1) of the Act, 21 U.S.C. section 360bbb-3(b)(1), unless the authorization is terminated or revoked sooner. Performed at [facility], [address] [place], [place] [address]        Studies: No results found.  Scheduled Meds: . [START ON 03/12/2019] amLODipine  10 mg Oral Daily  . amLODipine  5 mg Oral Once  . aspirin  81 mg Oral Daily  . atorvastatin  10 mg Oral Daily  . divalproex  125 mg Oral BID  . enoxaparin (LOVENOX) injection  40 mg Subcutaneous Q24H  . feeding supplement (ENSURE ENLIVE)  237 mL Oral BID BM  . ferrous sulfate  325 mg Oral Q breakfast  . levothyroxine  75 mcg Oral Q0600  . multivitamin with minerals  1 tablet Oral Daily  . vitamin C  500 mg Oral BID    Continuous Infusions: . sodium chloride 100 mL/hr at 03/11/19 0618     LOS: 4 days     Darlin Droparole N Nadege Carriger, MD [facility] Pager [phone]  If 7PM-7AM, please contact night-coverage [email or web address] Password [email or web address] 03/11/2019, 10:40 AM    PROGRESS NOTE  Marcelo BaldyJacqueline Chervenak WGN:[id number] DOB: 03/10/1941 DOA: 03/05/2019 PCP: Ron ParkerBowen, Samuel, MD  HPI/Recap of past 24 hours: [name] a 78 y.o.femalewith medical history significant fordementiaand nonverbal currently residing in memory care unit, HTN, HLD admitted with worsening anemia. She does not have a known history of anemia, however in early July had Hb of 10. She was brought to ER tonight due to an unwitnessed fall, with history taken from ER provider.Patient was admitted for further work-up of symptomatic anemia. Hgb has remained stable, without evidence of acute bleeding.  03/09/19: Patient was seen and examined at her bedside.  She is alert but confused in a state of advanced dementia.  Unable to obtain reliable history.  No complaints.   Assessment/Plan: Principal Problem:   Anemia Active Problems:   HTN (hypertension)   HLD (hyperlipidemia)   Dementia (HCC)   Symptomatic anemia  Fall likely secondary to orthostasis Positive orthostatic vital signs Urine analysis negative PT assessment recommended SNF with 24-hour assistance Fall precautions Continue IV fluid hydration normal saline at 100 cc/h   Symptomatic anemia Negative FOBT No sign of significant iron deficiency Baseline hemoglobin appears to be, 3 weeks ago Hemoglobin today 7.8 No abdominal pain or tenderness on palpation on exam Continue to monitor repeat H&H in the morning  if hemoglobin continues to trend down will obtain CT abdomen and pelvis  Dementia with behavioral disturbance Reorient as needed  Uncontrolled hypertension Resume antihypertensive Norvasc Continue to monitor vital signs  Hyperlipidemia Continue Lipitor  Seizure disorder No reported seizure activity Continue Depakote  Hypothyroidism Continue Synthroid   DVT prophylaxis: SCD/subcu Lovenox daily Code Status: Full Family Communication: None  Disposition Plan: Pending improvement in orthostatic VS and somnolence. Back to memory care unit on discharge.   Consultants:  None  Procedures:   None       Objective: Vitals:   03/10/19 0607 03/10/19 1235 03/10/19 2141 03/11/19 0622  BP: (!) 143/76 139/75 (!) 175/99 (!) 142/82  Pulse: 65 68 89 72  Resp: 18 17 14 16   Temp: 98.2 F (36.8 C) 98.7 F (37.1 C) 98.1 F (36.7 C) 97.8 F (36.6 C)  TempSrc: Oral Oral Oral Oral  SpO2: 97% 99% 99% 99%  Weight:      Height:        Intake/Output Summary (Last 24 hours) at 03/11/2019 1040 Last data filed at 03/11/2019 0900 Gross per 24 hour  Intake 2113.49 ml  Output 1350 ml  Net 763.49 ml   Filed Weights   03/06/19 0100 03/09/19 1500  Weight: 47.1 kg 47 kg    Exam:  . General: 78 y.o. year-old female well developed well nourished in no acute distress.  Alert and confused in a state of advanced dementia. . Cardiovascular: Regular rate and rhythm with no rubs or gallops.  No thyromegaly or JVD noted.   Marland Kitchen. Respiratory: Clear to auscultation with no wheezes or rales. Good inspiratory effort. . Abdomen: Soft nontender nondistended with normal bowel sounds x4 quadrants. . Musculoskeletal: Trace lower extremity edema. 2/4 pulses in all  4 extremities. Marland Kitchen. Psychiatry: Mood is appropriate for condition and setting   Data Reviewed: CBC: Recent Labs  Lab 03/05/19 2032  03/07/19 0323 03/08/19 0348 03/09/19 0358 03/10/19 0656 03/11/19 0629  WBC 8.0   < > 5.8 6.9 7.2 9.1 7.9  NEUTROABS 5.9  --   --   --   --   --   --   HGB 8.1*   < > 8.2* 7.7* 7.8* 8.1* 8.3*  HCT 25.9*   < > 27.3* 25.9* 26.4* 26.8* 26.6*  MCV 100.0   < > 99.3 100.8* 100.8* 100.8* 98.9  PLT 249   < > 281 257 264 262 242   < > = values in this interval not displayed.   Basic Metabolic Panel: Recent Labs  Lab 03/05/19 2032 03/08/19 0348  NA 139 140  K 4.2 3.8  CL 105 107  CO2 26 26  GLUCOSE 104* 87  BUN 19 15  CREATININE 0.60 0.57  CALCIUM 8.6* 8.3*   GFR: Estimated Creatinine Clearance: 43 mL/min (by C-G formula based on SCr of 0.57 mg/dL). Liver Function Tests: Recent Labs  Lab 03/06/19 0351  AST 28  ALT 35  ALKPHOS 65  BILITOT 1.2  PROT 5.9*  ALBUMIN 3.1*   No results for input(s): LIPASE, AMYLASE in the last 168 hours. No results for input(s): AMMONIA in the last 168 hours. Coagulation Profile: Recent Labs  Lab 03/05/19 2248  INR 1.0   Cardiac Enzymes: No results for input(s): CKTOTAL, CKMB, CKMBINDEX, TROPONINI in the last 168 hours. BNP (last 3 results) No results for input(s): PROBNP in the last 8760 hours. HbA1C: No results for input(s): HGBA1C in the last 72 hours. CBG: No results for input(s): GLUCAP in the last 168 hours. Lipid Profile: No results for input(s): CHOL, HDL, LDLCALC, TRIG, CHOLHDL, LDLDIRECT in the last 72 hours. Thyroid Function Tests: No results for input(s): TSH, T4TOTAL, FREET4, T3FREE, THYROIDAB in the last 72 hours. Anemia Panel: No results for input(s): VITAMINB12, FOLATE, FERRITIN, TIBC, IRON, RETICCTPCT in the last 72 hours. Urine analysis:    Component Value Date/Time   COLORURINE YELLOW 03/05/2019 1904   APPEARANCEUR CLEAR 03/05/2019 1904   LABSPEC 1.015 03/05/2019 1904   PHURINE  7.0 03/05/2019 1904   GLUCOSEU NEGATIVE 03/05/2019 1904   HGBUR NEGATIVE 03/05/2019 1904   BILIRUBINUR NEGATIVE 03/05/2019 1904   KETONESUR 5 (A) 03/05/2019 1904   PROTEINUR NEGATIVE 03/05/2019 1904   NITRITE NEGATIVE 03/05/2019 1904   LEUKOCYTESUR NEGATIVE 03/05/2019 1904   Sepsis Labs: @LABRCNTIP (procalcitonin:4,lacticidven:4)  ) Recent Results (from the past 240 hour(s))  Urine culture     Status: None   Collection Time: 03/05/19  7:05 PM   Specimen: Urine, Clean Catch  Result Value Ref Range Status   Specimen Description   Final    URINE, CLEAN CATCH Performed at [facility], [address], [place], [place] [address]    Special Requests   Final    NONE Performed at [facility], [address] [place], [place] [address]    Culture   Final    NO GROWTH Performed at [facility], [address], [place], [place] [address]    Report Status 03/07/2019 FINAL  Final  SARS CORONAVIRUS 2 Nasal Swab Aptima Multi Swab     Status: None   Collection Time: 03/06/19 12:23 AM   Specimen: Aptima Multi Swab; Nasal Swab  Result Value Ref Range Status   SARS Coronavirus 2 NEGATIVE NEGATIVE Final    Comment: (NOTE) SARS-CoV-2 target nucleic acids are NOT DETECTED. The SARS-CoV-2 RNA is generally detectable in upper and lower respiratory specimens during the acute phase of infection. Negative results do not preclude SARS-CoV-2 infection, do not rule out co-infections with other pathogens, and should not be used as the sole basis for treatment or other patient management decisions. Negative results must be combined with clinical observations, patient history, and epidemiological information. The expected result is Negative. Fact Sheet for Patients: HairSlick.no Fact Sheet for Healthcare Providers: quierodirigir.com This test is not yet approved or cleared by the [place] FDA and   has been authorized for detection and/or diagnosis of SARS-CoV-2 by FDA under an Emergency Use Authorization (EUA). This EUA will remain  in effect (meaning this test can be used) for the duration of the COVID-19 declaration under Section 56 4(b)(1) of the Act, 21 U.S.C. section 360bbb-3(b)(1), unless the authorization is terminated or revoked sooner. Performed at [facility], [address] [place], [place] [address]       Studies: No results found.  Scheduled Meds: . [START ON 03/12/2019] amLODipine  10 mg Oral Daily  . amLODipine  5 mg Oral Once  . aspirin  81 mg Oral Daily  . atorvastatin  10 mg Oral Daily  . divalproex  125 mg Oral BID  . enoxaparin (LOVENOX) injection  40 mg Subcutaneous Q24H  . feeding supplement (ENSURE ENLIVE)  237 mL Oral BID BM  . ferrous sulfate  325 mg Oral Q breakfast  . levothyroxine  75 mcg Oral Q0600  . multivitamin with minerals  1 tablet Oral Daily  . vitamin C  500 mg Oral BID    Continuous Infusions: . sodium chloride 100 mL/hr at 03/11/19 0618     LOS: 4 days     Darlin Drop, MD [facility] Pager [phone]  If 7PM-7AM, please contact night-coverage [email or web address] Password [email or web address] 03/11/2019, 10:40 AM

## 2019-03-12 MED ORDER — ASCORBIC ACID 500 MG PO TABS: 500.0000 mg | ORAL_TABLET | Freq: Two times a day (BID) | ORAL | 0 refills | Status: DC

## 2019-03-12 MED ORDER — LEVOTHYROXINE SODIUM 75 MCG PO TABS: 75.0000 ug | ORAL_TABLET | Freq: Every day | ORAL | 0 refills | Status: DC

## 2019-03-12 MED ORDER — ADULT MULTIVITAMIN W/MINERALS CH: 1.0000 | ORAL_TABLET | Freq: Every day | ORAL | 0 refills | Status: DC

## 2019-03-12 MED ORDER — AMLODIPINE BESYLATE 10 MG PO TABS: 10.0000 mg | ORAL_TABLET | Freq: Every day | ORAL | 0 refills | Status: DC

## 2019-03-12 MED ORDER — FERROUS SULFATE 325 (65 FE) MG PO TABS: 325.0000 mg | ORAL_TABLET | Freq: Every day | ORAL | 0 refills | Status: DC

## 2019-03-12 NOTE — TOC Transition Note (Addendum)
Transition of Care [facility]) - CM/SW Discharge Note   Patient Details  Name: Hanadi Stanly MRN: [id number] Date of Birth: August 11, 1940  [facility] CM/SW Contact:  Servando Snare, LCSW Phone Number: 03/12/2019, 3:58 PM   Clinical Narrative:  4:26 PM Per administrator patient cannot return until Monday. PTAR cancelled.    LCSW faxed dc docs to facility. LCSW spoke with med tech on duty who confirmed patients return to [facility].   RN report #: [id number]   Final next level of care: Memory Care Barriers to Discharge: No Barriers Identified   Patient Goals and CMS Choice        Discharge Placement              Patient chooses bed at: (returning to [facility]) Patient to be transferred to facility by: EMS Name of family member notified: Ronnie Patient and family notified of of transfer: 03/12/19  Discharge Plan and Services   Discharge Planning Services: CM Consult            DME Arranged: N/A DME Agency: NA       HH Arranged: NA HH Agency: NA        Social Determinants of Health (SDOH) Interventions     Readmission Risk Interventions No flowsheet data found.

## 2019-03-12 NOTE — Discharge Instructions (Addendum)
Dehydration, Adult  Dehydration is when there is not enough fluid or water in your body. This happens when you lose more fluids than you take in. Dehydration can range from mild to very bad. It should be treated right away to keep it from getting very bad. Symptoms of mild dehydration may include:  Thirst.  Dry lips.  Slightly dry mouth.  Dry, warm skin.  Dizziness. Symptoms of moderate dehydration may include:  Very dry mouth.  Muscle cramps.  Dark pee (urine). Pee may be the color of tea.  Your body making less pee.  Your eyes making fewer tears.  Heartbeat that is uneven or faster than normal (palpitations).  Headache.  Light-headedness, especially when you stand up from sitting.  Fainting (syncope). Symptoms of very bad dehydration may include:  Changes in skin, such as: ? Cold and clammy skin. ? Blotchy (mottled) or pale skin. ? Skin that does not quickly return to normal after being lightly pinched and let go (poor skin turgor).  Changes in body fluids, such as: ? Feeling very thirsty. ? Your eyes making fewer tears. ? Not sweating when body temperature is high, such as in hot weather. ? Your body making very little pee.  Changes in vital signs, such as: ? Weak pulse. ? Pulse that is more than 100 beats a minute when you are sitting still. ? Fast breathing. ? Low blood pressure.  Other changes, such as: ? Sunken eyes. ? Cold hands and feet. ? Confusion. ? Lack of energy (lethargy). ? Trouble waking up from sleep. ? Short-term weight loss. ? Unconsciousness. Follow these instructions at home:   If told by your doctor, drink an ORS: ? Make an ORS by using instructions on the package. ? Start by drinking small amounts, about  cup (120 mL) every 5-10 minutes. ? Slowly drink more until you have had the amount that your doctor said to have.  Drink enough clear fluid to keep your pee clear or pale yellow. If you were told to drink an ORS, finish the  ORS first, then start slowly drinking clear fluids. Drink fluids such as: ? Water. Do not drink only water by itself. Doing that can make the salt (sodium) level in your body get too low (hyponatremia). ? Ice chips. ? Fruit juice that you have added water to (diluted). ? Low-calorie sports drinks.  Avoid: ? Alcohol. ? Drinks that have a lot of sugar. These include high-calorie sports drinks, fruit juice that does not have water added, and soda. ? Caffeine. ? Foods that are greasy or have a lot of fat or sugar.  Take over-the-counter and prescription medicines only as told by your doctor.  Do not take salt tablets. Doing that can make the salt level in your body get too high (hypernatremia).  Eat foods that have minerals (electrolytes). Examples include bananas, oranges, potatoes, tomatoes, and spinach.  Keep all follow-up visits as told by your doctor. This is important. Contact a doctor if:  You have belly (abdominal) pain that: ? Gets worse. ? Stays in one area (localizes).  You have a rash.  You have a stiff neck.  You get angry or annoyed more easily than normal (irritability).  You are more sleepy than normal.  You have a harder time waking up than normal.  You feel: ? Weak. ? Dizzy. ? Very thirsty.  You have peed (urinated) only a small amount of very dark pee during 6-8 hours. Get help right away if:  You have  symptoms of very bad dehydration.  You cannot drink fluids without throwing up (vomiting).  Your symptoms get worse with treatment.  You have a fever.  You have a very bad headache.  You are throwing up or having watery poop (diarrhea) and it: ? Gets worse. ? Does not go away.  You have blood or something green (bile) in your throw-up.  You have blood in your poop (stool). This may cause poop to look black and tarry.  You have not peed in 6-8 hours.  You pass out (faint).  Your heart rate when you are sitting still is more than 100 beats a  minute.  You have trouble breathing. This information is not intended to replace advice given to you by your health care provider. Make sure you discuss any questions you have with your health care provider. Document Released: 05/17/2009 Document Revised: 07/03/2017 Document Reviewed: 09/14/2015 Elsevier Patient Education  [place]. Anemia  Anemia is a condition in which you do not have enough red blood cells or hemoglobin. Hemoglobin is a substance in red blood cells that carries oxygen. When you do not have enough red blood cells or hemoglobin (are anemic), your body cannot get enough oxygen and your organs may not work properly. As a result, you may feel very tired or have other problems. What are the causes? Common causes of anemia include:  Excessive bleeding. Anemia can be caused by excessive bleeding inside or outside the body, including bleeding from the intestine or from periods in women.  Poor nutrition.  Long-lasting (chronic) kidney, thyroid, and liver disease.  Bone marrow disorders.  Cancer and treatments for cancer.  HIV (human immunodeficiency virus) and AIDS (acquired immunodeficiency syndrome).  Treatments for HIV and AIDS.  Spleen problems.  Blood disorders.  Infections, medicines, and autoimmune disorders that destroy red blood cells. What are the signs or symptoms? Symptoms of this condition include:  Minor weakness.  Dizziness.  Headache.  Feeling heartbeats that are irregular or faster than normal (palpitations).  Shortness of breath, especially with exercise.  Paleness.  Cold sensitivity.  Indigestion.  Nausea.  Difficulty sleeping.  Difficulty concentrating. Symptoms may occur suddenly or develop slowly. If your anemia is mild, you may not have symptoms. How is this diagnosed? This condition is diagnosed based on:  Blood tests.  Your medical history.  A physical exam.  Bone marrow biopsy. Your health care provider  may also check your stool (feces) for blood and may do additional testing to look for the cause of your bleeding. You may also have other tests, including:  Imaging tests, such as a CT scan or MRI.  Endoscopy.  Colonoscopy. How is this treated? Treatment for this condition depends on the cause. If you continue to lose a lot of blood, you may need to be treated at a hospital. Treatment may include:  Taking supplements of iron, vitamin L27, or folic acid.  Taking a hormone medicine (erythropoietin) that can help to stimulate red blood cell growth.  Having a blood transfusion. This may be needed if you lose a lot of blood.  Making changes to your diet.  Having surgery to remove your spleen. Follow these instructions at home:  Take over-the-counter and prescription medicines only as told by your health care provider.  Take supplements only as told by your health care provider.  Follow any diet instructions that you were given.  Keep all follow-up visits as told by your health care provider. This is important. Contact a health  care provider if:  You develop new bleeding anywhere in the body. Get help right away if:  You are very weak.  You are short of breath.  You have pain in your abdomen or chest.  You are dizzy or feel faint.  You have trouble concentrating.  You have bloody or black, tarry stools.  You vomit repeatedly or you vomit up blood. Summary  Anemia is a condition in which you do not have enough red blood cells or enough of a substance in your red blood cells that carries oxygen (hemoglobin).  Symptoms may occur suddenly or develop slowly.  If your anemia is mild, you may not have symptoms.  This condition is diagnosed with blood tests as well as a medical history and physical exam. Other tests may be needed.  Treatment for this condition depends on the cause of the anemia. This information is not intended to replace advice given to you by your health  care provider. Make sure you discuss any questions you have with your health care provider. Document Released: 08/28/2004 Document Revised: 07/03/2017 Document Reviewed: 08/22/2016 Elsevier Patient Education  2020 [employer].

## 2019-03-12 NOTE — Discharge Summary (Signed)
Discharge Summary  Claire Hoffman KGM:[id number] DOB: 1941-05-26  PCP: Ron Parker, MD  Admit date: 03/05/2019 Discharge date: 03/12/2019  Time spent: 35 minutes  Recommendations for Outpatient Follow-up:  Follow-up with your primary care provider Take your medications as prescribed  Discharge Diagnoses:  Active Hospital Problems   Diagnosis Date Noted   Anemia 03/06/2019   Symptomatic anemia 03/07/2019   HTN (hypertension) 03/06/2019   HLD (hyperlipidemia) 03/06/2019   Dementia (HCC) 03/06/2019    Resolved Hospital Problems  No resolved problems to display.    Discharge Condition: Stable  Diet recommendation: Resume previous diet with feeding assistance.  Vitals:   03/12/19 0631 03/12/19 1345  BP: 138/80 (!) 116/56  Pulse: 72 79  Resp: 16 17  Temp: 98.2 F (36.8 C) 97.9 F (36.6 C)  SpO2: 100% 98%    History of present illness:  Ameliyah Pickardis a 78 y.o.femalewith medical history significant fordementiacurrently residing in memory care unit, HTN, HLD admitted with worsening anemia. She does not have a known history of anemia, however in early July had Hb of 10. She was brought to ER tonight due to an unwitnessed fall, with history taken from ER provider.Patient was admitted for further work-up of symptomatic anemia.Hgb has remained stable, without evidence of acute bleeding. Iron deficiency noted on iron studies. Dehydration noted requiring IV fluid hydration. Uncontrolled HTN requiring adjustment of her medications.  03/12/19: Patient was seen and examined at her bedside she is more alert and interactive today.  Does not appear in distress.  Vital signs and labs reviewed and are stable.  Eating without difficulty.  No new complaints.  Hospital Course:  Principal Problem:   Anemia Active Problems:   HTN (hypertension)   HLD (hyperlipidemia)   Dementia (HCC)   Symptomatic anemia  Fall likely secondary to orthostasis Positive orthostatic  vital signs Urine analysis negative PT assessment recommended SNF with 24-hour assistance Continue fall precautions Received IV fluid hydration normal saline at 100 cc/h  Resolved uncontrolled hypertension Norvasc increased from 5 mg to 10 mg daily  Continue to hold off losartan, avoid diuretics due to orthostasis  Resolved lethargy, unclear etiology Possibly related to overall physical debility and dehydration Continue oral supplement and physical therapy as tolerated Avoid dehydration, encourage oral intake  Iron deficiency anemia Negative FOBT Iron studies positive for iron deficiency on 03/05/2019 Continue ferrous sulfate 325 mg daily Hemoglobin improving 8.3 on 03/11/2019 from 8.1  No sign of overt bleeding No abdominal pain or nausea  Dementia with behavioral disturbance Reorient as needed  Hyperlipidemia Continue Lipitor  Seizure disorder No reported seizure activity Continue Depakote  Hypothyroidism Continue Synthroid    Code Status:Full   Consultants:  None  Procedures:  None    Discharge Exam: BP (!) 116/56 (BP Location: Right Arm)    Pulse 79    Temp 97.9 F (36.6 C) (Axillary)    Resp 17    Ht 5\' 2"  (1.575 m)    Wt 47 kg    SpO2 98%    BMI 18.95 kg/m   General: 78 y.o. year-old female well developed well nourished in no acute distress.  Alert and interactive in the setting of dementia.  Cardiovascular: Regular rate and rhythm with no rubs or gallops.  No thyromegaly or JVD noted.    Respiratory: Clear to auscultation with no wheezes or rales. Good inspiratory effort.  Abdomen: Soft nontender nondistended with normal bowel sounds x4 quadrants.  Psychiatry: Mood is appropriate for condition and setting  Discharge Instructions You  were cared for by a hospitalist during your hospital stay. If you have any questions about your discharge medications or the care you received while you were in the hospital after you are discharged, you  can call the unit and asked to speak with the hospitalist on call if the hospitalist that took care of you is not available. Once you are discharged, your primary care physician will handle any further medical issues. Please note that NO REFILLS for any discharge medications will be authorized once you are discharged, as it is imperative that you return to your primary care physician (or establish a relationship with a primary care physician if you do not have one) for your aftercare needs so that they can reassess your need for medications and monitor your lab values.   Allergies as of 03/12/2019   No Known Allergies     Medication List    STOP taking these medications   lisinopril 40 MG tablet Commonly known as: ZESTRIL   OLANZapine 5 MG tablet Commonly known as: ZYPREXA     TAKE these medications   amLODipine 10 MG tablet Commonly known as: NORVASC Take 1 tablet (10 mg total) by mouth daily. Start taking on: March 13, 2019 What changed:   medication strength  how much to take   ascorbic acid 500 MG tablet Commonly known as: VITAMIN C Take 1 tablet (500 mg total) by mouth 2 (two) times daily.   aspirin 81 MG chewable tablet Chew 81 mg by mouth daily.   atorvastatin 10 MG tablet Commonly known as: LIPITOR Take 10 mg by mouth daily.   divalproex 125 MG DR tablet Commonly known as: DEPAKOTE Take 125 mg by mouth 2 (two) times daily.   ferrous sulfate 325 (65 FE) MG tablet Take 1 tablet (325 mg total) by mouth daily with breakfast. Start taking on: March 13, 2019   haloperidol 5 MG tablet Commonly known as: HALDOL Take 5 mg by mouth 3 (three) times daily as needed for agitation.   levothyroxine 75 MCG tablet Commonly known as: SYNTHROID Take 1 tablet (75 mcg total) by mouth daily at 6 (six) AM. Start taking on: March 13, 2019 What changed: when to take this   multivitamin with minerals Tabs tablet Take 1 tablet by mouth daily. Start taking on: March 13, 2019     traZODone 50 MG tablet Commonly known as: DESYREL Take 50 mg by mouth at bedtime.      No Known Allergies Follow-up Information    Ron ParkerBowen, Samuel, MD. Call in 1 day(s).   Specialty: Internal Medicine Why: Please call for a post hospital follow-up appointment. Contact information: [address] [place] [address] [phone]            The results of significant diagnostics from this hospitalization (including imaging, microbiology, ancillary and laboratory) are listed below for reference.    Significant Diagnostic Studies: Dg Tibia/fibula Left  Result Date: 03/05/2019 CLINICAL DATA:  Fall, bruising EXAM: LEFT TIBIA AND FIBULA - 2 VIEW COMPARISON:  None. FINDINGS: There is no evidence of fracture or other focal bone lesions. Soft tissues are unremarkable. IMPRESSION: Negative. Electronically Signed   By: Charlett NoseKevin  Dover M.D.   On: 03/05/2019 20:52   Dg Ankle Complete Left  Result Date: 03/05/2019 CLINICAL DATA:  Fall EXAM: LEFT ANKLE COMPLETE - 3+ VIEW COMPARISON:  None. FINDINGS: There is no evidence of fracture, dislocation, or joint effusion. There is no evidence of arthropathy or other focal bone abnormality. Soft tissues are  unremarkable. IMPRESSION: Negative. Electronically Signed   By: Rolm Baptise M.D.   On: 03/05/2019 20:51   Ct Head Wo Contrast  Result Date: 03/05/2019 CLINICAL DATA:  Unwitnessed fall with hematoma on back of head. EXAM: CT HEAD WITHOUT CONTRAST CT CERVICAL SPINE WITHOUT CONTRAST TECHNIQUE: Multidetector CT imaging of the head and cervical spine was performed following the standard protocol without intravenous contrast. Multiplanar CT image reconstructions of the cervical spine were also generated. COMPARISON:  02/10/2019 FINDINGS: CT HEAD FINDINGS Brain: No evidence of acute infarction, hemorrhage, hydrocephalus, extra-axial collection or mass lesion/mass effect. There is mild diffuse low-attenuation within the subcortical and periventricular white  matter compatible with chronic microvascular disease. Prominence of sulci and ventricles compatible with brain atrophy. Vascular: No hyperdense vessel or unexpected calcification. Skull: Normal. Negative for fracture or focal lesion. Sinuses/Orbits: No acute finding. Other: None CT CERVICAL SPINE FINDINGS Alignment: Normal. Skull base and vertebrae: No acute fracture. No primary bone lesion or focal pathologic process. Soft tissues and spinal canal: No prevertebral fluid or swelling. No visible canal hematoma. Disc levels: There is marked disc space narrowing and endplate spurring identified at C4-5, C5-6 and C6-7. Bilateral facet hypertrophy and degenerative change noted. Upper chest: Negative. Other: None IMPRESSION: 1. No acute intracranial abnormality. Chronic small vessel ischemic disease and brain atrophy noted. 2. No acute cervical spine findings. Multi level cervical degenerative disc disease. Electronically Signed   By: Kerby Moors M.D.   On: 03/05/2019 19:54   Ct Head Wo Contrast  Result Date: 02/10/2019 CLINICAL DATA:  Head trauma, minor, GCS>=13, high clinical risk, initial exam; C-spine trauma, high clinical risk (NEXUS/CCR). Witnessed fall. EXAM: CT HEAD WITHOUT CONTRAST CT CERVICAL SPINE WITHOUT CONTRAST TECHNIQUE: Multidetector CT imaging of the head and cervical spine was performed following the standard protocol without intravenous contrast. Multiplanar CT image reconstructions of the cervical spine were also generated. COMPARISON:  None. FINDINGS: CT HEAD FINDINGS Brain: No intracranial hemorrhage, mass effect, or midline shift. Generalized atrophy. Moderate chronic small vessel ischemia. No hydrocephalus. The basilar cisterns are patent. No evidence of territorial infarct or acute ischemia. No extra-axial or intracranial fluid collection. Vascular: Atherosclerosis of skullbase vasculature without hyperdense vessel or abnormal calcification. Skull: No fracture or focal lesion.  Sinuses/Orbits: Paranasal sinuses and mastoid air cells are clear. The visualized orbits are unremarkable. Bilateral cataract resection. Other: Large posterior scalp hematoma. CT CERVICAL SPINE FINDINGS Mild motion artifact through C6-C7. Alignment: No traumatic subluxation. Trace anterolisthesis of C3 on C4 and minimal retrolisthesis of C6 on C7 appears degenerative. Skull base and vertebrae: No acute fracture. Vertebral body heights are maintained. The dens and skull base are intact. Soft tissues and spinal canal: No prevertebral fluid or swelling. No visible canal hematoma. Disc levels: Diffuse degenerative disc disease, most prominent at C5-C6 and C6-C7. Multilevel facet hypertrophy. Partial ankylosis of C5-C6 facets on the right is likely degenerative. Upper chest: No acute findings. Other: Mild carotid calcifications. IMPRESSION: 1. Large posterior scalp hematoma. No acute intracranial abnormality or skull fracture. 2. No fracture or subluxation of the cervical spine. Electronically Signed   By: Keith Rake M.D.   On: 02/10/2019 23:31   Ct Cervical Spine Wo Contrast  Result Date: 03/05/2019 CLINICAL DATA:  Unwitnessed fall with hematoma on back of head. EXAM: CT HEAD WITHOUT CONTRAST CT CERVICAL SPINE WITHOUT CONTRAST TECHNIQUE: Multidetector CT imaging of the head and cervical spine was performed following the standard protocol without intravenous contrast. Multiplanar CT image reconstructions of the cervical spine were also generated.  COMPARISON:  02/10/2019 FINDINGS: CT HEAD FINDINGS Brain: No evidence of acute infarction, hemorrhage, hydrocephalus, extra-axial collection or mass lesion/mass effect. There is mild diffuse low-attenuation within the subcortical and periventricular white matter compatible with chronic microvascular disease. Prominence of sulci and ventricles compatible with brain atrophy. Vascular: No hyperdense vessel or unexpected calcification. Skull: Normal. Negative for fracture  or focal lesion. Sinuses/Orbits: No acute finding. Other: None CT CERVICAL SPINE FINDINGS Alignment: Normal. Skull base and vertebrae: No acute fracture. No primary bone lesion or focal pathologic process. Soft tissues and spinal canal: No prevertebral fluid or swelling. No visible canal hematoma. Disc levels: There is marked disc space narrowing and endplate spurring identified at C4-5, C5-6 and C6-7. Bilateral facet hypertrophy and degenerative change noted. Upper chest: Negative. Other: None IMPRESSION: 1. No acute intracranial abnormality. Chronic small vessel ischemic disease and brain atrophy noted. 2. No acute cervical spine findings. Multi level cervical degenerative disc disease. Electronically Signed   By: Signa Kellaylor  Stroud M.D.   On: 03/05/2019 19:54   Ct Cervical Spine Wo Contrast  Result Date: 02/10/2019 CLINICAL DATA:  Head trauma, minor, GCS>=13, high clinical risk, initial exam; C-spine trauma, high clinical risk (NEXUS/CCR). Witnessed fall. EXAM: CT HEAD WITHOUT CONTRAST CT CERVICAL SPINE WITHOUT CONTRAST TECHNIQUE: Multidetector CT imaging of the head and cervical spine was performed following the standard protocol without intravenous contrast. Multiplanar CT image reconstructions of the cervical spine were also generated. COMPARISON:  None. FINDINGS: CT HEAD FINDINGS Brain: No intracranial hemorrhage, mass effect, or midline shift. Generalized atrophy. Moderate chronic small vessel ischemia. No hydrocephalus. The basilar cisterns are patent. No evidence of territorial infarct or acute ischemia. No extra-axial or intracranial fluid collection. Vascular: Atherosclerosis of skullbase vasculature without hyperdense vessel or abnormal calcification. Skull: No fracture or focal lesion. Sinuses/Orbits: Paranasal sinuses and mastoid air cells are clear. The visualized orbits are unremarkable. Bilateral cataract resection. Other: Large posterior scalp hematoma. CT CERVICAL SPINE FINDINGS Mild motion  artifact through C6-C7. Alignment: No traumatic subluxation. Trace anterolisthesis of C3 on C4 and minimal retrolisthesis of C6 on C7 appears degenerative. Skull base and vertebrae: No acute fracture. Vertebral body heights are maintained. The dens and skull base are intact. Soft tissues and spinal canal: No prevertebral fluid or swelling. No visible canal hematoma. Disc levels: Diffuse degenerative disc disease, most prominent at C5-C6 and C6-C7. Multilevel facet hypertrophy. Partial ankylosis of C5-C6 facets on the right is likely degenerative. Upper chest: No acute findings. Other: Mild carotid calcifications. IMPRESSION: 1. Large posterior scalp hematoma. No acute intracranial abnormality or skull fracture. 2. No fracture or subluxation of the cervical spine. Electronically Signed   By: Narda RutherfordMelanie  Sanford M.D.   On: 02/10/2019 23:31   Dg Chest Port 1 View  Result Date: 03/05/2019 CLINICAL DATA:  Syncope.  Dementia. EXAM: PORTABLE CHEST 1 VIEW COMPARISON:  February 11, 2019 FINDINGS: There is no edema or consolidation. Heart size and pulmonary vascularity are normal. No adenopathy. There is aortic atherosclerosis. No pneumothorax. No bone lesions. IMPRESSION: No edema or consolidation. Stable cardiac silhouette. Aortic Atherosclerosis (ICD10-I70.0). Electronically Signed   By: Bretta BangWilliam  Woodruff III M.D.   On: 03/05/2019 19:34   Dg Chest Port 1 View  Result Date: 02/11/2019 CLINICAL DATA:  Fall.  Weakness.  Dementia. EXAM: PORTABLE CHEST 1 VIEW COMPARISON:  No prior. FINDINGS: Mediastinum is normal. Cardiomegaly. No pulmonary venous congestion. No focal infiltrate. No pleural effusion or pneumothorax. No displaced rib fractures noted. Degenerative changes both shoulders and thoracic spine. Thoracic spine scoliosis concave  left. IMPRESSION: 1.  Cardiomegaly.  No pulmonary venous congestion. 2.  No acute pulmonary disease. Electronically Signed   By: Maisie Fushomas  Register   On: 02/11/2019 05:50   Dg Hip Unilat With  Pelvis 2-3 Views Left  Result Date: 03/05/2019 CLINICAL DATA:  Un witnessed fall. EXAM: DG HIP (WITH OR WITHOUT PELVIS) 2-3V LEFT COMPARISON:  None FINDINGS: There is no evidence of acute fracture or dislocation. Moderate left hip osteoarthritis with joint space narrowing, subchondral sclerosis and marginal spur formation. Mild right hip osteoarthritis. IMPRESSION: 1. No acute fracture identified. 2. Moderate left hip osteoarthritis. Electronically Signed   By: Signa Kellaylor  Stroud M.D.   On: 03/05/2019 20:53    Microbiology: Recent Results (from the past 240 hour(s))  Urine culture     Status: None   Collection Time: 03/05/19  7:05 PM   Specimen: Urine, Clean Catch  Result Value Ref Range Status   Specimen Description   Final    URINE, CLEAN CATCH Performed at [facility], [address] [place], [place] [address]    Special Requests   Final    NONE Performed at [facility], [address] [place], [place] [address]    Culture   Final    NO GROWTH Performed at [facility], [address] [place], [place] [address]    Report Status 03/07/2019 FINAL  Final  SARS CORONAVIRUS 2 Nasal Swab Aptima Multi Swab     Status: None   Collection Time: 03/06/19 12:23 AM   Specimen: Aptima Multi Swab; Nasal Swab  Result Value Ref Range Status   SARS Coronavirus 2 NEGATIVE NEGATIVE Final    Comment: (NOTE) SARS-CoV-2 target nucleic acids are NOT DETECTED. The SARS-CoV-2 RNA is generally detectable in upper and lower respiratory specimens during the acute phase of infection. Negative results do not preclude SARS-CoV-2 infection, do not rule out co-infections with other pathogens, and should not be used as the sole basis for treatment or other patient management decisions. Negative results must be combined with clinical observations, patient history, and epidemiological information. The expected result is Negative. Fact Sheet for  Patients: HairSlick.nohttps://www.fda.gov/media/138098/download Fact Sheet for Healthcare Providers: quierodirigir.comhttps://www.fda.gov/media/138095/download This test is not yet approved or cleared by the [place] FDA and  has been authorized for detection and/or diagnosis of SARS-CoV-2 by FDA under an Emergency Use Authorization (EUA). This EUA will remain  in effect (meaning this test can be used) for the duration of the COVID-19 declaration under Section 56 4(b)(1) of the Act, 21 U.S.C. section 360bbb-3(b)(1), unless the authorization is terminated or revoked sooner. Performed at [facility], [address] [place], [place] [address]      Labs: Basic Metabolic Panel: Recent Labs  Lab 03/05/19 2032 03/08/19 0348  NA 139 140  K 4.2 3.8  CL 105 107  CO2 26 26  GLUCOSE 104* 87  BUN 19 15  CREATININE 0.60 0.57  CALCIUM 8.6* 8.3*   Liver Function Tests: Recent Labs  Lab 03/06/19 0351  AST 28  ALT 35  ALKPHOS 65  BILITOT 1.2  PROT 5.9*  ALBUMIN 3.1*   No results for input(s): LIPASE, AMYLASE in the last 168 hours. No results for input(s): AMMONIA in the last 168 hours. CBC: Recent Labs  Lab 03/05/19 2032  03/07/19 0323 03/08/19 0348 03/09/19 0358 03/10/19 0656 03/11/19 0629  WBC 8.0   < > 5.8 6.9 7.2 9.1 7.9  NEUTROABS 5.9  --   --   --   --   --   --  HGB 8.1*   < > 8.2* 7.7* 7.8* 8.1* 8.3*  HCT 25.9*   < > 27.3* 25.9* 26.4* 26.8* 26.6*  MCV 100.0   < > 99.3 100.8* 100.8* 100.8* 98.9  PLT 249   < > 281 257 264 262 242   < > = values in this interval not displayed.   Cardiac Enzymes: No results for input(s): CKTOTAL, CKMB, CKMBINDEX, TROPONINI in the last 168 hours. BNP: BNP (last 3 results) No results for input(s): BNP in the last 8760 hours.  ProBNP (last 3 results) No results for input(s): PROBNP in the last 8760 hours.  CBG: Recent Labs  Lab 03/11/19 1318  GLUCAP 131*       Signed:  Darlin Drop, MD [facility] 03/12/2019, 2:33 PM

## 2019-03-12 NOTE — Progress Notes (Signed)
Patient's husband, Edd Arbour, contacted and given update.

## 2019-03-13 LAB — SARS CORONAVIRUS 2 (TAT 6-24 HRS): SARS Coronavirus 2: NEGATIVE

## 2019-03-13 NOTE — Discharge Summary (Signed)
Discharge Summary  Claire BaldyJacqueline Hoffman NWG:[id number] DOB: 07/10/1941  PCP: Ron ParkerBowen, Samuel, MD  Admit date: 03/05/2019 Discharge date: 03/13/2019  Time spent: 35 minutes  DC delayed on 03/12/19: Per administrator patient cannot return until Monday. PTAR cancelled.   Recommendations for Outpatient Follow-up:  Follow-up with your primary care provider Take your medications as prescribed  Discharge Diagnoses:  Active Hospital Problems   Diagnosis Date Noted  . Anemia 03/06/2019  . Symptomatic anemia 03/07/2019  . HTN (hypertension) 03/06/2019  . HLD (hyperlipidemia) 03/06/2019  . Dementia (HCC) 03/06/2019    Resolved Hospital Problems  No resolved problems to display.    Discharge Condition: Stable  Diet recommendation: Resume previous diet with feeding assistance.  Vitals:   03/12/19 2216 03/13/19 0538  BP: 120/89 134/72  Pulse: 81 65  Resp: 14 16  Temp: 97.8 F (36.6 C) (!) 97.5 F (36.4 C)  SpO2: 100% 95%    History of present illness:  Claire LankJacqueline Pickardis a 78 y.o.femalewith medical history significant fordementiacurrently residing in memory care unit, HTN, HLD admitted with worsening anemia. She does not have a known history of anemia, however in early July had Hb of 10. She was brought to ER tonight due to an unwitnessed fall, with history taken from ER provider.Patient was admitted for further work-up of symptomatic anemia.Hgb has remained stable, without evidence of acute bleeding. Iron deficiency noted on iron studies. Dehydration noted requiring IV fluid hydration. Uncontrolled HTN requiring adjustment of her medications.  03/12/19: Patient was seen and examined at her bedside she is more alert and interactive today.  Does not appear in distress.  Vital signs and labs reviewed and are stable.  Eating without difficulty.  No new complaints.  03/13/19: No new complaints.  Repeat COVID-19 test done on 03/12/2019 negative.  Vital signs reviewed and are stable.  Eating  well.  IV fluid rate of normal saline decreased to 50 cc/h.  Hospital Course:  Principal Problem:   Anemia Active Problems:   HTN (hypertension)   HLD (hyperlipidemia)   Dementia (HCC)   Symptomatic anemia  Fall likely secondary to orthostasis Positive orthostatic vital signs Urine analysis negative PT assessment recommended SNF with 24-hour assistance Continue fall precautions Received IV fluid hydration normal saline at 100 cc/h>> decreased rate to 50 cc/h on 03/13/2019. Good oral intake.  Resolved uncontrolled hypertension Norvasc increased from 5 mg to 10 mg daily  Continue to hold off losartan, avoid diuretics due to orthostasis  Resolved lethargy, unclear etiology Possibly related to overall physical debility and dehydration Continue oral supplement and physical therapy as tolerated Avoid dehydration, encourage oral intake  Iron deficiency anemia Negative FOBT Iron studies positive for iron deficiency on 03/05/2019 Continue ferrous sulfate 325 mg daily Hemoglobin improving 8.3 on 03/11/2019 from 8.1  No sign of overt bleeding No abdominal pain or nausea  Dementia with behavioral disturbance Reorient as needed  Hyperlipidemia Continue Lipitor  Seizure disorder No reported seizure activity Continue Depakote  Hypothyroidism Continue Synthroid  Physical debility/ambulatory dysfunction PT recommended SNF with 24 hours assistance. We will discharge to SNF tomorrow 03/14/2019. Repeat COVID-19 requested by SNF facility done on 03/12/2019 negative.  Code Status:Full   Consultants:  None  Procedures:  None    Discharge Exam: BP 134/72 (BP Location: Right Arm)   Pulse 65   Temp (!) 97.5 F (36.4 C) (Oral)   Resp 16   Ht 5\' 2"  (1.575 m)   Wt 47 kg   SpO2 95%   BMI 18.95 kg/m  .  General: 78 y.o. year-old female well-developed well-nourished no acute distress.  Alert and confused in a state of advanced dementia. . Cardiovascular: Regular rate  and rhythm no rubs or gallops no JVD or thyromegaly. Marland Kitchen Respiratory: Clear to auscultation with no wheezes or rales.  Good inspiratory effort. . Abdomen: Soft nontender nondistended normal bowel sounds present.  Marland Kitchen Psychiatry: Mood is appropriate for condition and setting.  Discharge Instructions You were cared for by a hospitalist during your hospital stay. If you have any questions about your discharge medications or the care you received while you were in the hospital after you are discharged, you can call the unit and asked to speak with the hospitalist on call if the hospitalist that took care of you is not available. Once you are discharged, your primary care physician will handle any further medical issues. Please note that NO REFILLS for any discharge medications will be authorized once you are discharged, as it is imperative that you return to your primary care physician (or establish a relationship with a primary care physician if you do not have one) for your aftercare needs so that they can reassess your need for medications and monitor your lab values.   Allergies as of 03/13/2019   No Known Allergies     Medication List    STOP taking these medications   lisinopril 40 MG tablet Commonly known as: ZESTRIL   OLANZapine 5 MG tablet Commonly known as: ZYPREXA     TAKE these medications   amLODipine 10 MG tablet Commonly known as: NORVASC Take 1 tablet (10 mg total) by mouth daily. What changed:   medication strength  how much to take   ascorbic acid 500 MG tablet Commonly known as: VITAMIN C Take 1 tablet (500 mg total) by mouth 2 (two) times daily.   aspirin 81 MG chewable tablet Chew 81 mg by mouth daily.   atorvastatin 10 MG tablet Commonly known as: LIPITOR Take 10 mg by mouth daily.   divalproex 125 MG DR tablet Commonly known as: DEPAKOTE Take 125 mg by mouth 2 (two) times daily.   ferrous sulfate 325 (65 FE) MG tablet Take 1 tablet (325 mg total) by mouth  daily with breakfast.   haloperidol 5 MG tablet Commonly known as: HALDOL Take 5 mg by mouth 3 (three) times daily as needed for agitation.   levothyroxine 75 MCG tablet Commonly known as: SYNTHROID Take 1 tablet (75 mcg total) by mouth daily at 6 (six) AM. What changed: when to take this   multivitamin with minerals Tabs tablet Take 1 tablet by mouth daily.   traZODone 50 MG tablet Commonly known as: DESYREL Take 50 mg by mouth at bedtime.      No Known Allergies Follow-up Information    Ron Parker, MD. Call in 1 day(s).   Specialty: Internal Medicine Why: Please call for a post hospital follow-up appointment. Contact information: [address] [place] [address] [phone]            The results of significant diagnostics from this hospitalization (including imaging, microbiology, ancillary and laboratory) are listed below for reference.    Significant Diagnostic Studies: Dg Tibia/fibula Left  Result Date: 03/05/2019 CLINICAL DATA:  Fall, bruising EXAM: LEFT TIBIA AND FIBULA - 2 VIEW COMPARISON:  None. FINDINGS: There is no evidence of fracture or other focal bone lesions. Soft tissues are unremarkable. IMPRESSION: Negative. Electronically Signed   By: Charlett Nose M.D.   On: 03/05/2019 20:52   Dg  Ankle Complete Left  Result Date: 03/05/2019 CLINICAL DATA:  Fall EXAM: LEFT ANKLE COMPLETE - 3+ VIEW COMPARISON:  None. FINDINGS: There is no evidence of fracture, dislocation, or joint effusion. There is no evidence of arthropathy or other focal bone abnormality. Soft tissues are unremarkable. IMPRESSION: Negative. Electronically Signed   By: Charlett NoseKevin  Dover M.D.   On: 03/05/2019 20:51   Ct Head Wo Contrast  Result Date: 03/05/2019 CLINICAL DATA:  Unwitnessed fall with hematoma on back of head. EXAM: CT HEAD WITHOUT CONTRAST CT CERVICAL SPINE WITHOUT CONTRAST TECHNIQUE: Multidetector CT imaging of the head and cervical spine was performed following the standard  protocol without intravenous contrast. Multiplanar CT image reconstructions of the cervical spine were also generated. COMPARISON:  02/10/2019 FINDINGS: CT HEAD FINDINGS Brain: No evidence of acute infarction, hemorrhage, hydrocephalus, extra-axial collection or mass lesion/mass effect. There is mild diffuse low-attenuation within the subcortical and periventricular white matter compatible with chronic microvascular disease. Prominence of sulci and ventricles compatible with brain atrophy. Vascular: No hyperdense vessel or unexpected calcification. Skull: Normal. Negative for fracture or focal lesion. Sinuses/Orbits: No acute finding. Other: None CT CERVICAL SPINE FINDINGS Alignment: Normal. Skull base and vertebrae: No acute fracture. No primary bone lesion or focal pathologic process. Soft tissues and spinal canal: No prevertebral fluid or swelling. No visible canal hematoma. Disc levels: There is marked disc space narrowing and endplate spurring identified at C4-5, C5-6 and C6-7. Bilateral facet hypertrophy and degenerative change noted. Upper chest: Negative. Other: None IMPRESSION: 1. No acute intracranial abnormality. Chronic small vessel ischemic disease and brain atrophy noted. 2. No acute cervical spine findings. Multi level cervical degenerative disc disease. Electronically Signed   By: Signa Kellaylor  [place] M.D.   On: 03/05/2019 19:54   Ct Cervical Spine Wo Contrast  Result Date: 03/05/2019 CLINICAL DATA:  Unwitnessed fall with hematoma on back of head. EXAM: CT HEAD WITHOUT CONTRAST CT CERVICAL SPINE WITHOUT CONTRAST TECHNIQUE: Multidetector CT imaging of the head and cervical spine was performed following the standard protocol without intravenous contrast. Multiplanar CT image reconstructions of the cervical spine were also generated. COMPARISON:  02/10/2019 FINDINGS: CT HEAD FINDINGS Brain: No evidence of acute infarction, hemorrhage, hydrocephalus, extra-axial collection or mass lesion/mass effect. There  is mild diffuse low-attenuation within the subcortical and periventricular white matter compatible with chronic microvascular disease. Prominence of sulci and ventricles compatible with brain atrophy. Vascular: No hyperdense vessel or unexpected calcification. Skull: Normal. Negative for fracture or focal lesion. Sinuses/Orbits: No acute finding. Other: None CT CERVICAL SPINE FINDINGS Alignment: Normal. Skull base and vertebrae: No acute fracture. No primary bone lesion or focal pathologic process. Soft tissues and spinal canal: No prevertebral fluid or swelling. No visible canal hematoma. Disc levels: There is marked disc space narrowing and endplate spurring identified at C4-5, C5-6 and C6-7. Bilateral facet hypertrophy and degenerative change noted. Upper chest: Negative. Other: None IMPRESSION: 1. No acute intracranial abnormality. Chronic small vessel ischemic disease and brain atrophy noted. 2. No acute cervical spine findings. Multi level cervical degenerative disc disease. Electronically Signed   By: Signa Kellaylor  [place] M.D.   On: 03/05/2019 19:54   Dg Chest Port 1 View  Result Date: 03/05/2019 CLINICAL DATA:  Syncope.  Dementia. EXAM: PORTABLE CHEST 1 VIEW COMPARISON:  February 11, 2019 FINDINGS: There is no edema or consolidation. Heart size and pulmonary vascularity are normal. No adenopathy. There is aortic atherosclerosis. No pneumothorax. No bone lesions. IMPRESSION: No edema or consolidation. Stable cardiac silhouette. Aortic Atherosclerosis (ICD10-I70.0). Electronically Signed  By: Lowella Grip III M.D.   On: 03/05/2019 19:34   Dg Hip Unilat With Pelvis 2-3 Views Left  Result Date: 03/05/2019 CLINICAL DATA:  Un witnessed fall. EXAM: DG HIP (WITH OR WITHOUT PELVIS) 2-3V LEFT COMPARISON:  None FINDINGS: There is no evidence of acute fracture or dislocation. Moderate left hip osteoarthritis with joint space narrowing, subchondral sclerosis and marginal spur formation. Mild right hip osteoarthritis.  IMPRESSION: 1. No acute fracture identified. 2. Moderate left hip osteoarthritis. Electronically Signed   By: Kerby Moors M.D.   On: 03/05/2019 20:53    Microbiology: Recent Results (from the past 240 hour(s))  Urine culture     Status: None   Collection Time: 03/05/19  7:05 PM   Specimen: Urine, Clean Catch  Result Value Ref Range Status   Specimen Description   Final    URINE, CLEAN CATCH Performed at [facility], [place] [address], [place], [place] [address]    Special Requests   Final    NONE Performed at [facility], [place] [address] [place], [place] [address]    Culture   Final    NO GROWTH Performed at [facility], [place] [address] [place], [place] [address]    Report Status 03/07/2019 FINAL  Final  SARS CORONAVIRUS 2 Nasal Swab Aptima Multi Swab     Status: None   Collection Time: 03/06/19 12:23 AM   Specimen: Aptima Multi Swab; Nasal Swab  Result Value Ref Range Status   SARS Coronavirus 2 NEGATIVE NEGATIVE Final    Comment: (NOTE) SARS-CoV-2 target nucleic acids are NOT DETECTED. The SARS-CoV-2 RNA is generally detectable in upper and lower respiratory specimens during the acute phase of infection. Negative results do not preclude SARS-CoV-2 infection, do not rule out co-infections with other pathogens, and should not be used as the sole basis for treatment or other patient management decisions. Negative results must be combined with clinical observations, patient history, and epidemiological information. The expected result is Negative. Fact Sheet for Patients: SugarRoll.be Fact Sheet for Healthcare Providers: https://www.woods-mathews.com/ This test is not yet approved or cleared by the [place] FDA and  has been authorized for detection and/or diagnosis of SARS-CoV-2 by FDA under an Emergency Use Authorization (EUA). This EUA will remain  in effect (meaning this test can  be used) for the duration of the COVID-19 declaration under Section 56 4(b)(1) of the Act, 21 U.S.C. section 360bbb-3(b)(1), unless the authorization is terminated or revoked sooner. Performed at [facility], [place] [address] [place], [place] [address]   SARS CORONAVIRUS 2     Status: None   Collection Time: 03/12/19  5:27 PM  Result Value Ref Range Status   SARS Coronavirus 2 NEGATIVE NEGATIVE Final    Comment: (NOTE) SARS-CoV-2 target nucleic acids are NOT DETECTED. The SARS-CoV-2 RNA is generally detectable in upper and lower respiratory specimens during the acute phase of infection. Negative results do not preclude SARS-CoV-2 infection, do not rule out co-infections with other pathogens, and should not be used as the sole basis for treatment or other patient management decisions. Negative results must be combined with clinical observations, patient history, and epidemiological information. The expected result is Negative. Fact Sheet for Patients: SugarRoll.be Fact Sheet for Healthcare Providers: https://www.woods-mathews.com/ This test is not yet approved or cleared by the [place] FDA and  has been authorized for detection and/or diagnosis of SARS-CoV-2 by FDA under an Emergency Use Authorization (EUA). This EUA will remain  in effect (meaning this test can be used) for the duration of the COVID-19 declaration under Section 56 4(b)(1) of the Act, 21 U.S.C. section 360bbb-3(b)(1), unless the authorization is terminated or revoked sooner. Performed at [facility], [address] [place], [place] [id number]      Labs: Basic Metabolic Panel: Recent Labs  Lab 03/08/19 0348  NA 140  K 3.8  CL 107  CO2 26  GLUCOSE 87  BUN 15  CREATININE 0.57  CALCIUM 8.3*   Liver Function Tests: No results for input(s): AST, ALT, ALKPHOS, BILITOT, PROT, ALBUMIN in the last 168 hours. No results for input(s): LIPASE, AMYLASE  in the last 168 hours. No results for input(s): AMMONIA in the last 168 hours. CBC: Recent Labs  Lab 03/07/19 0323 03/08/19 0348 03/09/19 0358 03/10/19 0656 03/11/19 0629  WBC 5.8 6.9 7.2 9.1 7.9  HGB 8.2* 7.7* 7.8* 8.1* 8.3*  HCT 27.3* 25.9* 26.4* 26.8* 26.6*  MCV 99.3 100.8* 100.8* 100.8* 98.9  PLT 281 257 264 262 242   Cardiac Enzymes: No results for input(s): CKTOTAL, CKMB, CKMBINDEX, TROPONINI in the last 168 hours. BNP: BNP (last 3 results) No results for input(s): BNP in the last 8760 hours.  ProBNP (last 3 results) No results for input(s): PROBNP in the last 8760 hours.  CBG: Recent Labs  Lab 03/11/19 1318  GLUCAP 131*       Signed:  Darlin Droparole N Payslie Mccaig, MD [facility] 03/13/2019, 10:57 AM

## 2019-03-13 NOTE — Progress Notes (Signed)
Attempted to call pt husband, Edd Arbour, with no answer. Left message to call us if any questions.

## 2019-03-14 NOTE — Care Management Important Message (Signed)
Important Message  Patient Details  Name: Claire Hoffman MRN: [id number] Date of Birth: 1941-04-18   Medicare Important Message Given:  Yes. CMA printed out IM for the Case Management Nurse or CSW to give to the patient.      Runa Whittingham 03/14/2019, 11:44 AM

## 2019-03-14 NOTE — Plan of Care (Signed)

## 2019-03-14 NOTE — Discharge Summary (Signed)
Discharge Summary  Marcelo BaldyJacqueline Tarver EAV:[id number] DOB: 09/27/1940  PCP: Ron ParkerBowen, Samuel, MD  Admit date: 03/05/2019 Discharge date: 03/14/2019  Time spent: 35 minutes  DC delayed on 03/12/19: Per administrator patient cannot return until Monday. PTAR cancelled.   Recommendations for Outpatient Follow-up:  Follow-up with your primary care provider Take your medications as prescribed  Discharge Diagnoses:  Active Hospital Problems   Diagnosis Date Noted   Anemia 03/06/2019   Symptomatic anemia 03/07/2019   HTN (hypertension) 03/06/2019   HLD (hyperlipidemia) 03/06/2019   Dementia (HCC) 03/06/2019    Resolved Hospital Problems  No resolved problems to display.    Discharge Condition: Stable  Diet recommendation: Resume previous diet with feeding assistance.  Vitals:   03/13/19 2146 03/14/19 0424  BP: 110/69 114/63  Pulse: 87 82  Resp: 17 17  Temp: 98.9 F (37.2 C) 98.4 F (36.9 C)  SpO2:  97%    History of present illness:  Claire LankJacqueline Pickardis a 78 y.o.femalewith medical history significant fordementiacurrently residing in memory care unit, HTN, HLD admitted with worsening anemia. She does not have a known history of anemia, however in early July had Hb of 10. She was brought to ER tonight due to an unwitnessed fall, with history taken from ER provider.Patient was admitted for further work-up of symptomatic anemia.Hgb has remained stable, without evidence of acute bleeding. Iron deficiency noted on iron studies. Dehydration noted requiring IV fluid hydration. Uncontrolled HTN requiring adjustment of her medications.  03/12/19: Patient was seen and examined at her bedside she is more alert and interactive today.  Does not appear in distress.  Vital signs and labs reviewed and are stable.  Eating without difficulty.  No new complaints.  03/13/19: No new complaints.  Repeat COVID-19 test done on 03/12/2019 negative.  Vital signs reviewed and are stable.  Eating well.   IV fluid rate of normal saline decreased to 50 cc/h.  03/14/19: Patient was seen and examined at her bedside this morning.  No acute events overnight.  No new complaints.  Vital signs and labs reviewed and are stable.  Patient is medically stable for discharge to SNF.  Hospital Course:  Principal Problem:   Anemia Active Problems:   HTN (hypertension)   HLD (hyperlipidemia)   Dementia (HCC)   Symptomatic anemia  Fall likely secondary to orthostasis Positive orthostatic vital signs Urine analysis negative PT assessment recommended SNF with 24-hour assistance Continue fall precautions Received IV fluid hydration normal saline at 100 cc/h>> decreased rate to 50 cc/h on 03/13/2019. Good oral intake.  Resolved uncontrolled hypertension Norvasc increased from 5 mg to 10 mg daily  Continue to hold off losartan, avoid diuretics due to orthostasis  Resolved lethargy, unclear etiology Possibly related to overall physical debility and dehydration Continue oral supplement and physical therapy as tolerated Avoid dehydration, encourage oral intake  Iron deficiency anemia Negative FOBT Iron studies positive for iron deficiency on 03/05/2019 Continue ferrous sulfate 325 mg daily Hemoglobin improving 8.3 on 03/11/2019 from 8.1  No sign of overt bleeding No abdominal pain or nausea  Dementia with behavioral disturbance Reorient as needed  Hyperlipidemia Continue Lipitor  Seizure disorder No reported seizure activity Continue Depakote  Hypothyroidism Continue Synthroid  Physical debility/ambulatory dysfunction PT recommended SNF with 24 hours assistance. We will discharge to SNF tomorrow 03/14/2019. Repeat COVID-19 requested by SNF facility done on 03/12/2019 negative.  Code Status:Full   Consultants:  None  Procedures:  None    Discharge Exam: BP 114/63 (BP Location: Left Arm)  Pulse 82    Temp 98.4 F (36.9 C) (Oral)    Resp 17    Ht 5\' 2"  (1.575 m)    Wt  47 kg    SpO2 97%    BMI 18.95 kg/m   General: 78 y.o. year-old female well-developed well-nourished in no acute distress.  Alert in the setting of advanced dementia.  Cardiovascular: Regular rate and rhythm no rubs or gallops.  No JVD or thyromegaly noted.    Respiratory: Clear to auscultation no wheezes or rales.  Good inspiratory effort.    Abdomen: Soft nontender nondistended with normal bowel sounds present.   Psychiatry: Mood is appropriate for condition and setting.  Discharge Instructions You were cared for by a hospitalist during your hospital stay. If you have any questions about your discharge medications or the care you received while you were in the hospital after you are discharged, you can call the unit and asked to speak with the hospitalist on call if the hospitalist that took care of you is not available. Once you are discharged, your primary care physician will handle any further medical issues. Please note that NO REFILLS for any discharge medications will be authorized once you are discharged, as it is imperative that you return to your primary care physician (or establish a relationship with a primary care physician if you do not have one) for your aftercare needs so that they can reassess your need for medications and monitor your lab values.   Allergies as of 03/14/2019   No Known Allergies     Medication List    STOP taking these medications   lisinopril 40 MG tablet Commonly known as: ZESTRIL   OLANZapine 5 MG tablet Commonly known as: ZYPREXA     TAKE these medications   amLODipine 10 MG tablet Commonly known as: NORVASC Take 1 tablet (10 mg total) by mouth daily. What changed:   medication strength  how much to take   ascorbic acid 500 MG tablet Commonly known as: VITAMIN C Take 1 tablet (500 mg total) by mouth 2 (two) times daily.   aspirin 81 MG chewable tablet Chew 81 mg by mouth daily.   atorvastatin 10 MG tablet Commonly known as:  LIPITOR Take 10 mg by mouth daily.   divalproex 125 MG DR tablet Commonly known as: DEPAKOTE Take 125 mg by mouth 2 (two) times daily.   ferrous sulfate 325 (65 FE) MG tablet Take 1 tablet (325 mg total) by mouth daily with breakfast.   haloperidol 5 MG tablet Commonly known as: HALDOL Take 5 mg by mouth 3 (three) times daily as needed for agitation.   levothyroxine 75 MCG tablet Commonly known as: SYNTHROID Take 1 tablet (75 mcg total) by mouth daily at 6 (six) AM. What changed: when to take this   multivitamin with minerals Tabs tablet Take 1 tablet by mouth daily.   traZODone 50 MG tablet Commonly known as: DESYREL Take 50 mg by mouth at bedtime.      No Known Allergies Follow-up Information    Ron Parker, MD. Call in 1 day(s).   Specialty: Internal Medicine Why: Please call for a post hospital follow-up appointment. Contact information: [address] [place] [address] [phone]            The results of significant diagnostics from this hospitalization (including imaging, microbiology, ancillary and laboratory) are listed below for reference.    Significant Diagnostic Studies: Dg Tibia/fibula Left  Result Date: 03/05/2019 CLINICAL DATA:  Fall, bruising EXAM: LEFT TIBIA AND FIBULA - 2 VIEW COMPARISON:  None. FINDINGS: There is no evidence of fracture or other focal bone lesions. Soft tissues are unremarkable. IMPRESSION: Negative. Electronically Signed   By: Rolm Baptise M.D.   On: 03/05/2019 20:52   Dg Ankle Complete Left  Result Date: 03/05/2019 CLINICAL DATA:  Fall EXAM: LEFT ANKLE COMPLETE - 3+ VIEW COMPARISON:  None. FINDINGS: There is no evidence of fracture, dislocation, or joint effusion. There is no evidence of arthropathy or other focal bone abnormality. Soft tissues are unremarkable. IMPRESSION: Negative. Electronically Signed   By: Rolm Baptise M.D.   On: 03/05/2019 20:51   Ct Head Wo Contrast  Result Date: 03/05/2019 CLINICAL DATA:   Unwitnessed fall with hematoma on back of head. EXAM: CT HEAD WITHOUT CONTRAST CT CERVICAL SPINE WITHOUT CONTRAST TECHNIQUE: Multidetector CT imaging of the head and cervical spine was performed following the standard protocol without intravenous contrast. Multiplanar CT image reconstructions of the cervical spine were also generated. COMPARISON:  02/10/2019 FINDINGS: CT HEAD FINDINGS Brain: No evidence of acute infarction, hemorrhage, hydrocephalus, extra-axial collection or mass lesion/mass effect. There is mild diffuse low-attenuation within the subcortical and periventricular white matter compatible with chronic microvascular disease. Prominence of sulci and ventricles compatible with brain atrophy. Vascular: No hyperdense vessel or unexpected calcification. Skull: Normal. Negative for fracture or focal lesion. Sinuses/Orbits: No acute finding. Other: None CT CERVICAL SPINE FINDINGS Alignment: Normal. Skull base and vertebrae: No acute fracture. No primary bone lesion or focal pathologic process. Soft tissues and spinal canal: No prevertebral fluid or swelling. No visible canal hematoma. Disc levels: There is marked disc space narrowing and endplate spurring identified at C4-5, C5-6 and C6-7. Bilateral facet hypertrophy and degenerative change noted. Upper chest: Negative. Other: None IMPRESSION: 1. No acute intracranial abnormality. Chronic small vessel ischemic disease and brain atrophy noted. 2. No acute cervical spine findings. Multi level cervical degenerative disc disease. Electronically Signed   By: Kerby Moors M.D.   On: 03/05/2019 19:54   Ct Cervical Spine Wo Contrast  Result Date: 03/05/2019 CLINICAL DATA:  Unwitnessed fall with hematoma on back of head. EXAM: CT HEAD WITHOUT CONTRAST CT CERVICAL SPINE WITHOUT CONTRAST TECHNIQUE: Multidetector CT imaging of the head and cervical spine was performed following the standard protocol without intravenous contrast. Multiplanar CT image reconstructions  of the cervical spine were also generated. COMPARISON:  02/10/2019 FINDINGS: CT HEAD FINDINGS Brain: No evidence of acute infarction, hemorrhage, hydrocephalus, extra-axial collection or mass lesion/mass effect. There is mild diffuse low-attenuation within the subcortical and periventricular white matter compatible with chronic microvascular disease. Prominence of sulci and ventricles compatible with brain atrophy. Vascular: No hyperdense vessel or unexpected calcification. Skull: Normal. Negative for fracture or focal lesion. Sinuses/Orbits: No acute finding. Other: None CT CERVICAL SPINE FINDINGS Alignment: Normal. Skull base and vertebrae: No acute fracture. No primary bone lesion or focal pathologic process. Soft tissues and spinal canal: No prevertebral fluid or swelling. No visible canal hematoma. Disc levels: There is marked disc space narrowing and endplate spurring identified at C4-5, C5-6 and C6-7. Bilateral facet hypertrophy and degenerative change noted. Upper chest: Negative. Other: None IMPRESSION: 1. No acute intracranial abnormality. Chronic small vessel ischemic disease and brain atrophy noted. 2. No acute cervical spine findings. Multi level cervical degenerative disc disease. Electronically Signed   By: Kerby Moors M.D.   On: 03/05/2019 19:54   Dg Chest Port 1 View  Result Date: 03/05/2019 CLINICAL DATA:  Syncope.  Dementia. EXAM:  PORTABLE CHEST 1 VIEW COMPARISON:  February 11, 2019 FINDINGS: There is no edema or consolidation. Heart size and pulmonary vascularity are normal. No adenopathy. There is aortic atherosclerosis. No pneumothorax. No bone lesions. IMPRESSION: No edema or consolidation. Stable cardiac silhouette. Aortic Atherosclerosis (ICD10-I70.0). Electronically Signed   By: Bretta BangWilliam  Woodruff III M.D.   On: 03/05/2019 19:34   Dg Hip Unilat With Pelvis 2-3 Views Left  Result Date: 03/05/2019 CLINICAL DATA:  Un witnessed fall. EXAM: DG HIP (WITH OR WITHOUT PELVIS) 2-3V LEFT  COMPARISON:  None FINDINGS: There is no evidence of acute fracture or dislocation. Moderate left hip osteoarthritis with joint space narrowing, subchondral sclerosis and marginal spur formation. Mild right hip osteoarthritis. IMPRESSION: 1. No acute fracture identified. 2. Moderate left hip osteoarthritis. Electronically Signed   By: Signa Kellaylor  Stroud M.D.   On: 03/05/2019 20:53    Microbiology: Recent Results (from the past 240 hour(s))  Urine culture     Status: None   Collection Time: 03/05/19  7:05 PM   Specimen: Urine, Clean Catch  Result Value Ref Range Status   Specimen Description   Final    URINE, CLEAN CATCH Performed at [facility], [address] [place], [place] [address]    Special Requests   Final    NONE Performed at [facility], [address] [place], [place] [address]    Culture   Final    NO GROWTH Performed at [facility], [address] [place], [place] [address]    Report Status 03/07/2019 FINAL  Final  SARS CORONAVIRUS 2 Nasal Swab Aptima Multi Swab     Status: None   Collection Time: 03/06/19 12:23 AM   Specimen: Aptima Multi Swab; Nasal Swab  Result Value Ref Range Status   SARS Coronavirus 2 NEGATIVE NEGATIVE Final    Comment: (NOTE) SARS-CoV-2 target nucleic acids are NOT DETECTED. The SARS-CoV-2 RNA is generally detectable in upper and lower respiratory specimens during the acute phase of infection. Negative results do not preclude SARS-CoV-2 infection, do not rule out co-infections with other pathogens, and should not be used as the sole basis for treatment or other patient management decisions. Negative results must be combined with clinical observations, patient history, and epidemiological information. The expected result is Negative. Fact Sheet for Patients: HairSlick.nohttps://www.fda.gov/media/138098/download Fact Sheet for Healthcare Providers: quierodirigir.comhttps://www.fda.gov/media/138095/download This test is not  yet approved or cleared by the [place] FDA and  has been authorized for detection and/or diagnosis of SARS-CoV-2 by FDA under an Emergency Use Authorization (EUA). This EUA will remain  in effect (meaning this test can be used) for the duration of the COVID-19 declaration under Section 56 4(b)(1) of the Act, 21 U.S.C. section 360bbb-3(b)(1), unless the authorization is terminated or revoked sooner. Performed at [facility], [address] [place], [place] [address]   SARS CORONAVIRUS 2     Status: None   Collection Time: 03/12/19  5:27 PM  Result Value Ref Range Status   SARS Coronavirus 2 NEGATIVE NEGATIVE Final    Comment: (NOTE) SARS-CoV-2 target nucleic acids are NOT DETECTED. The SARS-CoV-2 RNA is generally detectable in upper and lower respiratory specimens during the acute phase of infection. Negative results do not preclude SARS-CoV-2 infection, do not rule out co-infections with other pathogens, and should not be used as the sole basis for treatment or other patient management decisions. Negative results must be combined with clinical observations, patient history, and epidemiological information. The expected result  is Negative. Fact Sheet for Patients: HairSlick.nohttps://www.fda.gov/media/138098/download Fact Sheet for Healthcare Providers: quierodirigir.comhttps://www.fda.gov/media/138095/download This test is not yet approved or cleared by the [place] FDA and  has been authorized for detection and/or diagnosis of SARS-CoV-2 by FDA under an Emergency Use Authorization (EUA). This EUA will remain  in effect (meaning this test can be used) for the duration of the COVID-19 declaration under Section 56 4(b)(1) of the Act, 21 U.S.C. section 360bbb-3(b)(1), unless the authorization is terminated or revoked sooner. Performed at [facility], [address] [place], [place] [address]      Labs: Basic Metabolic Panel: Recent Labs  Lab 03/08/19 0348  NA 140  K 3.8    CL 107  CO2 26  GLUCOSE 87  BUN 15  CREATININE 0.57  CALCIUM 8.3*   Liver Function Tests: No results for input(s): AST, ALT, ALKPHOS, BILITOT, PROT, ALBUMIN in the last 168 hours. No results for input(s): LIPASE, AMYLASE in the last 168 hours. No results for input(s): AMMONIA in the last 168 hours. CBC: Recent Labs  Lab 03/08/19 0348 03/09/19 0358 03/10/19 0656 03/11/19 0629  WBC 6.9 7.2 9.1 7.9  HGB 7.7* 7.8* 8.1* 8.3*  HCT 25.9* 26.4* 26.8* 26.6*  MCV 100.8* 100.8* 100.8* 98.9  PLT 257 264 262 242   Cardiac Enzymes: No results for input(s): CKTOTAL, CKMB, CKMBINDEX, TROPONINI in the last 168 hours. BNP: BNP (last 3 results) No results for input(s): BNP in the last 8760 hours.  ProBNP (last 3 results) No results for input(s): PROBNP in the last 8760 hours.  CBG: Recent Labs  Lab 03/11/19 1318  GLUCAP 131*       Signed:  Darlin Droparole N Deontae Robson, MD [facility] 03/14/2019, 10:19 AM

## 2019-03-14 NOTE — [place] FL2 (Addendum)
[place] MEDICAID FL2 LEVEL OF CARE SCREENING TOOL     IDENTIFICATION  Patient Name: Claire BaldyJacqueline Laux Birthdate: 1941-06-07 Sex: female Admission Date (Current Location): 03/05/2019  [place]   Facility and Address:  [facility],  [address], [place] [address]      Provider Number: [id number]  Attending Physician Name and Address:  Darlin DropHall, Carole N, DO  Relative Name and Phone Number:       Current Level of Care: Hospital Recommended Level of Care: Memory Care Prior Approval Number:    Date Approved/Denied:   PASRR Number:    Discharge Plan: Other (Comment)(memory care)    Current Diagnoses: Patient Active Problem List   Diagnosis Date Noted  . Symptomatic anemia 03/07/2019  . Anemia 03/06/2019  . HTN (hypertension) 03/06/2019  . HLD (hyperlipidemia) 03/06/2019  . Dementia (HCC) 03/06/2019    Orientation RESPIRATION BLADDER Height & Weight     (disoriented x4)  Normal Incontinent Weight: 47 kg Height:  5\' 2"  (157.5 cm)  BEHAVIORAL SYMPTOMS/MOOD NEUROLOGICAL BOWEL NUTRITION STATUS      Continent Diet  AMBULATORY STATUS COMMUNICATION OF NEEDS Skin   Extensive Assist Verbally Normal                       Personal Care Assistance Level of Assistance  Bathing, Dressing, Total care Bathing Assistance: Maximum assistance   Dressing Assistance: Maximum assistance Total Care Assistance: Maximum assistance   Functional Limitations Info             SPECIAL CARE FACTORS FREQUENCY                       Contractures Contractures Info: Not present    Additional Factors Info   Diet  Regular diet             Current Medications (03/14/2019):  This is the current hospital active medication list Current Facility-Administered Medications  Medication Dose Route Frequency Provider Last Rate Last Dose  . 0.9 %  sodium chloride infusion   Intravenous Continuous Darlin DropHall, Carole N, DO 50 mL/hr at 03/13/19  2049    . acetaminophen (TYLENOL) tablet 650 mg  650 mg Oral Q6H PRN Colon BranchIkramullah, Mir Mohammed, MD   650 mg at 03/11/19 2233   Or  . acetaminophen (TYLENOL) suppository 650 mg  650 mg Rectal Q6H PRN Kirby CriglerIkramullah, Mir Mohammed, MD      . amLODipine [facility](NORVASC) tablet 10 mg  10 mg Oral Daily Dow AdolphHall, Carole N, DO   10 mg at 03/14/19 0948  . aspirin chewable tablet 81 mg  81 mg Oral Daily Kirby CriglerIkramullah, Mir Mohammed, MD   81 mg at 03/14/19 0948  . atorvastatin (LIPITOR) tablet 10 mg  10 mg Oral Daily Kirby CriglerIkramullah, Mir Mohammed, MD   10 mg at 03/14/19 0948  . divalproex (DEPAKOTE) DR tablet 125 mg  125 mg Oral BID Colon BranchIkramullah, Mir Mohammed, MD   125 mg at 03/14/19 0948  . enoxaparin (LOVENOX) injection 40 mg  40 mg Subcutaneous Q24H Hall, Carole N, DO   40 mg at 03/14/19 1357  . feeding supplement (ENSURE ENLIVE) (ENSURE ENLIVE) liquid 237 mL  237 mL Oral BID BM Noralee Stainhoi, Jennifer, DO   237 mL at 03/14/19 1358  . ferrous sulfate tablet 325 mg  325 mg Oral Q breakfast Darlin DropHall, Carole N, DO   325 mg at 03/14/19 0758  . levothyroxine (SYNTHROID) tablet 75 mcg  75 mcg Oral  [id number] Dessa Phi, DO   75 mcg at 03/14/19 0604  . multivitamin with minerals tablet 1 tablet  1 tablet Oral Daily Dessa Phi, DO   1 tablet at 03/14/19 0948  . ondansetron (ZOFRAN) tablet 4 mg  4 mg Oral Q6H PRN Hollice Gong, Mir Earlie Server, MD       Or  . ondansetron [facility]) injection 4 mg  4 mg Intravenous Q6H PRN Hollice Gong, Mir Mohammed, MD      . polyethylene glycol (MIRALAX / GLYCOLAX) packet 17 g  17 g Oral Daily PRN Hollice Gong, Mir Mohammed, MD      . vitamin C (ASCORBIC ACID) tablet 500 mg  500 mg Oral BID Dessa Phi, DO   500 mg at 03/14/19 6712     Discharge Medications: Please see discharge summary for a list of discharge medications.  Relevant Imaging Results:  Relevant Lab Results:   Additional Information SSN [id number]  Purcell Mouton, RN

## 2019-03-14 NOTE — Progress Notes (Signed)
Claire Hoffman to be D/C'd Skilled nursing facility per MD order.  Discussed prescriptions and follow up appointments with the patient. Prescriptions given to patient, medication list explained in detail. Pt verbalized understanding.  Allergies as of 03/14/2019   No Known Allergies     Medication List    STOP taking these medications   lisinopril 40 MG tablet Commonly known as: ZESTRIL   OLANZapine 5 MG tablet Commonly known as: ZYPREXA     TAKE these medications   amLODipine 10 MG tablet Commonly known as: NORVASC Take 1 tablet (10 mg total) by mouth daily. What changed:   medication strength  how much to take   ascorbic acid 500 MG tablet Commonly known as: VITAMIN C Take 1 tablet (500 mg total) by mouth 2 (two) times daily.   aspirin 81 MG chewable tablet Chew 81 mg by mouth daily.   atorvastatin 10 MG tablet Commonly known as: LIPITOR Take 10 mg by mouth daily.   divalproex 125 MG DR tablet Commonly known as: DEPAKOTE Take 125 mg by mouth 2 (two) times daily.   ferrous sulfate 325 (65 FE) MG tablet Take 1 tablet (325 mg total) by mouth daily with breakfast.   haloperidol 5 MG tablet Commonly known as: HALDOL Take 5 mg by mouth 3 (three) times daily as needed for agitation.   levothyroxine 75 MCG tablet Commonly known as: SYNTHROID Take 1 tablet (75 mcg total) by mouth daily at 6 (six) AM. What changed: when to take this   multivitamin with minerals Tabs tablet Take 1 tablet by mouth daily.   traZODone 50 MG tablet Commonly known as: DESYREL Take 50 mg by mouth at bedtime.       Vitals:   03/14/19 0424 03/14/19 1335  BP: 114/63 135/86  Pulse: 82 98  Resp: 17 16  Temp: 98.4 F (36.9 C) 98 F (36.7 C)  SpO2: 97% 95%    Skin clean, dry and intact without evidence of skin break down, no evidence of skin tears noted. IV catheter discontinued intact. Site without signs and symptoms of complications. Dressing and pressure applied. Pt denies  pain at this time. No complaints noted.  An After Visit Summary was printed and given to the Claire Hoffman. Report called to RN at receiving facility. Patient transferred to [facility] via [facility].  Claire Hoffman 03/14/2019 2:46 PM

## 2019-03-14 NOTE — Progress Notes (Signed)
Physical Therapy Treatment Patient Details Name: Claire BaldyJacqueline Tuman MRN: [id number] DOB: 17-Jan-1941 Today's Date: 03/14/2019    History of Present Illness Pt is a 78 y.o. female with PMH significant for dementia (currently residing in memory care unit), HTN, HLD admitted with worsening anemia. She was brought to ER due to an unwitnessed fall, and symptomatic anemia    PT Comments    Patient was resting in bed at start of session but easily aroused with auditory and tactile cues.  She was agreeable to participate in therapy and required decreased assistance at start of session for supine to sit transfer compared to prior therapy visits. Patient continues to be limited by decreased ability to follow commands but did successfully perform 1x sit<>stand transfer at EOB. She has significant posterior lean in standing requiring mod assist for balance. Pt was unable to follow additional commands to repeat transfer and after multiple attempts pt was assisted back to supine in bed. Will continue to follow and progress mobility as able.    Follow Up Recommendations  SNF;Supervision/Assistance - 24 hour     Equipment Recommendations  None recommended by PT    Recommendations for Other Services       Precautions / Restrictions Precautions Precautions: Fall Precaution Comments: mittens currently in place Restrictions Weight Bearing Restrictions: No    Mobility  Bed Mobility Overal bed mobility: Needs Assistance Bed Mobility: Supine to Sit;Sit to Supine     Supine to sit: Min assist Sit to supine: Mod assist   General bed mobility comments: pt able to initiate supine to sit with HOB slightly elevated. min assist required to complete full upright sitting and scoot to EOB with feet on floor. Pt required mod assist to return to supine in bed and reposition  Transfers Overall transfer level: Needs assistance Equipment used: 1 person hand held assist Transfers: Sit to/from Stand Sit to  Stand: Mod assist         General transfer comment: pt with 1 occasion following visual and verbal command for sit to stand transfer. pt maintained standing for several seconds with significnat posterior lean and then returned to sitting. she would not follow any additional cues for transfers  Ambulation/Gait                 Stairs             Wheelchair Mobility    Modified Rankin (Stroke Patients Only)       Balance Overall balance assessment: Needs assistance Sitting-balance support: Feet supported;Single extremity supported Sitting balance-Leahy Scale: Poor Sitting balance - Comments: pt sitting EOB requried assist to position feet, pt attempting to lay back down on Rt and Lt side during session but able to be re-oriented to sit up Postural control: Posterior lean Standing balance support: Single extremity supported;During functional activity Standing balance-Leahy Scale: Poor Standing balance comment: pt requires support via 1 person hand held assist, significant posterior lean in initial standing               Cognition Arousal/Alertness: Awake/alert(initially lethargic but arousable with auditory and tactile cues) Behavior During Therapy: Flat affect Overall Cognitive Status: No family/caregiver present to determine baseline cognitive functioning            General Comments: hx of dementia, does not follow commands, pt with 1 epsiode of following visual and auditory command to stand but inconsistant throughout             Pertinent Vitals/Pain Pain Assessment: Faces Faces Pain  Scale: No hurt Pain Intervention(s): Repositioned;Monitored during session           PT Goals (current goals can now be found in the care plan section) Acute Rehab PT Goals Patient Stated Goal: pt did not verbalize any goals, improve transfer and gait safety PT Goal Formulation: Patient unable to participate in goal setting Time For Goal Achievement:  03/14/19 Potential to Achieve Goals: Good Progress towards PT goals: Progressing toward goals    Frequency    Min 2X/week      PT Plan Current plan remains appropriate       AM-PAC PT "6 Clicks" Mobility   Outcome Measure  Help needed turning from your back to your side while in a flat bed without using bedrails?: A Lot Help needed moving from lying on your back to sitting on the side of a flat bed without using bedrails?: A Lot Help needed moving to and from a bed to a chair (including a wheelchair)?: Total Help needed standing up from a chair using your arms (e.g., wheelchair or bedside chair)?: Total Help needed to walk in hospital room?: Total Help needed climbing 3-5 steps with a railing? : Total 6 Click Score: 8    End of Session Equipment Utilized During Treatment: Gait belt Activity Tolerance: Other (comment)(limited by cognition) Patient left: in bed;with call bell/phone within reach;with bed alarm set Nurse Communication: Mobility status PT Visit Diagnosis: History of falling (Z91.81);Difficulty in walking, not elsewhere classified (R26.2);Muscle weakness (generalized) (M62.81)     Time: 7672-0947 PT Time Calculation (min) (ACUTE ONLY): 22 min  Charges:  $Therapeutic Activity: 8-22 mins                     Kipp Brood, PT, DPT, [facility] Physical Therapist with [facility]  03/14/2019 2:42 PM

## 2019-04-18 ENCOUNTER — Emergency Department (HOSPITAL_COMMUNITY)
Admission: EM | Admit: 2019-04-18 | Discharge: 2019-04-18 | Disposition: A | Discharge status: Home / Self Care | Payer: Medicare Other | Attending: Emergency Medicine | Admitting: Emergency Medicine

## 2019-04-18 ENCOUNTER — Other Ambulatory Visit: Payer: Self-pay

## 2019-04-18 ENCOUNTER — Emergency Department (HOSPITAL_COMMUNITY): Payer: Medicare Other

## 2019-04-18 DIAGNOSIS — F0390 Unspecified dementia without behavioral disturbance: Secondary | ICD-10-CM | POA: Insufficient documentation

## 2019-04-18 DIAGNOSIS — Y939 Activity, unspecified: Secondary | ICD-10-CM | POA: Insufficient documentation

## 2019-04-18 DIAGNOSIS — W19XXXA Unspecified fall, initial encounter: Secondary | ICD-10-CM | POA: Diagnosis not present

## 2019-04-18 DIAGNOSIS — Y92129 Unspecified place in nursing home as the place of occurrence of the external cause: Secondary | ICD-10-CM | POA: Diagnosis not present

## 2019-04-18 DIAGNOSIS — I10 Essential (primary) hypertension: Secondary | ICD-10-CM | POA: Insufficient documentation

## 2019-04-18 DIAGNOSIS — Y999 Unspecified external cause status: Secondary | ICD-10-CM | POA: Diagnosis not present

## 2019-04-18 DIAGNOSIS — Z7982 Long term (current) use of aspirin: Secondary | ICD-10-CM | POA: Insufficient documentation

## 2019-04-18 DIAGNOSIS — Z79899 Other long term (current) drug therapy: Secondary | ICD-10-CM | POA: Insufficient documentation

## 2019-04-18 DIAGNOSIS — S0101XA Laceration without foreign body of scalp, initial encounter: Secondary | ICD-10-CM | POA: Diagnosis not present

## 2019-04-18 NOTE — ED Notes (Signed)
Discharge instructions discussed with Pt. Pt stable and leaving via PTAR. Pt unable to verbalize understanding.

## 2019-04-18 NOTE — ED Triage Notes (Signed)
Arrives ems pt here with fall and hematoma to back of her head that is bleeding. Pt has hx of falls. Pt uses wheelchair to get around. Denies blood thinners.

## 2019-04-18 NOTE — ED Provider Notes (Signed)
[facility] EMERGENCY DEPARTMENT Provider Note   CSN: [id number] Arrival date & time: 04/18/19  1646     History   Chief Complaint Chief Complaint  Patient presents with   Fall   Head Laceration    HPI Claire Hoffman is a 78 y.o. female with a past medical history of anemia, hypertension, HLD, and dementia who presents emergency department from her nursing home after a fall.  Patient is unable to provide history due to her dementia however she is able to state her name and denies being in any pain currently.  EMS reports patient had a fall and noted a hematoma and some bleeding to the back of her head.  Nursing home reported the patient was not on any blood thinners and had a history of prior falls.  Collateral obtained from nursing home reports the fall was not witnessed and the patient uses a wheelchair at baseline to get around.  Nursing home reports patient is nonverbal due to her dementia.  Medication list and nursing facility shows patient is on 81 mg of ASA daily but no other blood thinners.    The history is provided by the EMS personnel, the nursing home and medical records.    No past medical history on file.  Patient Active Problem List   Diagnosis Date Noted   Symptomatic anemia 03/07/2019   Anemia 03/06/2019   HTN (hypertension) 03/06/2019   HLD (hyperlipidemia) 03/06/2019   Dementia (HCC) 03/06/2019    No past surgical history on file.   OB History   No obstetric history on file.      Home Medications    Prior to Admission medications   Medication Sig Start Date End Date Taking? Authorizing Provider  amLODipine (NORVASC) 10 MG tablet Take 1 tablet (10 mg total) by mouth daily. 03/13/19   Darlin Drop, DO  aspirin 81 MG chewable tablet Chew 81 mg by mouth daily.    [provider]  atorvastatin (LIPITOR) 10 MG tablet Take 10 mg by mouth daily.    [provider]  divalproex (DEPAKOTE) 125 MG DR tablet Take  125 mg by mouth 2 (two) times daily.    [provider]  ferrous sulfate 325 (65 FE) MG tablet Take 1 tablet (325 mg total) by mouth daily with breakfast. 03/13/19   Darlin Drop, DO  haloperidol (HALDOL) 5 MG tablet Take 5 mg by mouth 3 (three) times daily as needed for agitation.    [provider]  levothyroxine (SYNTHROID) 75 MCG tablet Take 1 tablet (75 mcg total) by mouth daily at 6 (six) AM. 03/13/19   Darlin Drop, DO  Multiple Vitamin (MULTIVITAMIN WITH MINERALS) TABS tablet Take 1 tablet by mouth daily. 03/13/19   Darlin Drop, DO  traZODone (DESYREL) 50 MG tablet Take 50 mg by mouth at bedtime.    [provider]  vitamin C (VITAMIN C) 500 MG tablet Take 1 tablet (500 mg total) by mouth 2 (two) times daily. 03/12/19   Darlin Drop, DO    Family History No family history on file.  Social History Social History   Tobacco Use   Smoking status: Never Smoker   Smokeless tobacco: Never Used  Substance Use Topics   Alcohol use: Not on file   Drug use: Not on file     Allergies   Patient has no known allergies.   Review of Systems Review of Systems  Unable to perform ROS: Dementia  Physical Exam Updated Vital Signs BP 123/76    Pulse 81    Temp 97.8 F (36.6 C) (Axillary)    Resp 16    Ht 5\' 2"  (1.575 m)    Wt 47 kg    SpO2 99%    BMI 18.95 kg/m   Physical Exam Constitutional:      General: She is not in acute distress. HENT:     Head:     Comments: Hematoma to R back of head with blood in hair, hemostatic 2 cm laceration to right posterior scalp    Right Ear: External ear normal.     Left Ear: External ear normal.     Nose: Nose normal.     Mouth/Throat:     Mouth: Mucous membranes are moist.     Pharynx: Oropharynx is clear.  Eyes:     Pupils: Pupils are equal, round, and reactive to light.  Neck:     Comments: c-collar inplace Cardiovascular:     Rate and Rhythm: Normal rate and regular rhythm.  Pulmonary:     Effort:  Pulmonary effort is normal. No respiratory distress.     Breath sounds: No stridor. No wheezing or rales.  Chest:     Chest wall: No tenderness.  Abdominal:     Palpations: Abdomen is soft.     Tenderness: There is no abdominal tenderness. There is no guarding or rebound.  Musculoskeletal:     Right lower leg: No edema.     Left lower leg: No edema.  Skin:    General: Skin is warm and dry.     Findings: Signs of injury present.     Comments: Skin tear to R elbow  Neurological:     Mental Status: She is alert. Mental status is at baseline. She is disoriented.     GCS: GCS eye subscore is 4. GCS verbal subscore is 4. GCS motor subscore is 6.     Cranial Nerves: No facial asymmetry.     Motor: No tremor.     Comments: Intermittently following commands.  Spontaneously moving all 4 extremities.  Difficult to assess coordination as patient has difficulty with multistep commands.  Gait not assessed.      ED Treatments / Results  Labs (all labs ordered are listed, but only abnormal results are displayed) Labs Reviewed - No data to display  EKG None  Radiology Ct Head Wo Contrast  Result Date: 04/18/2019 CLINICAL DATA:  Arrives via EMS patient here with fall and hematoma to back of her head that is bleeding. Pt has hx of falls. Pt uses wheelchair to get around. Denies blood thinners. EXAM: CT HEAD WITHOUT CONTRAST CT CERVICAL SPINE WITHOUT CONTRAST TECHNIQUE: Multidetector CT imaging of the head and cervical spine was performed following the standard protocol without intravenous contrast. Multiplanar CT image reconstructions of the cervical spine were also generated. COMPARISON:  03/05/2019 FINDINGS: CT HEAD FINDINGS Brain: No evidence of acute infarction, hemorrhage, hydrocephalus, extra-axial collection or mass lesion/mass effect. There is ventricular and sulcal enlargement reflecting mild diffuse atrophy. Patchy white matter hypoattenuation is noted bilaterally consistent with moderate  chronic microvascular ischemic change. Vascular: No hyperdense vessel or unexpected calcification. Skull: Normal. Negative for fracture or focal lesion. Sinuses/Orbits: Globes and orbits are unremarkable. Visualized sinuses and mastoid air cells are clear. Other: Right parietal scalp hematoma. CT CERVICAL SPINE FINDINGS Alignment: Mild reversal the normal cervical lordosis, apex at C5. No spondylolisthesis. Skull base and vertebrae: No acute fracture. No primary bone  lesion or focal pathologic process. Soft tissues and spinal canal: No prevertebral fluid or swelling. No visible canal hematoma. Disc levels: Moderate loss of disc height at C3-C4. Moderate to marked loss of disc height at C4-C5, C5-C6 and C6-C7. Spondylotic disc bulging with endplate spurring noted at these levels. No convincing disc herniation. There are facet degenerative changes greatest at C3-C4. Upper chest: No acute findings. Other: None. IMPRESSION: HEAD CT 1. No acute intracranial abnormalities. 2. No skull fracture. 3. Right parietal scalp hematoma. CERVICAL CT 1. No fracture or acute finding. Electronically Signed   By: Amie Portlandavid  Ormond M.D.   On: 04/18/2019 19:25   Ct Cervical Spine Wo Contrast  Result Date: 04/18/2019 CLINICAL DATA:  Arrives via EMS patient here with fall and hematoma to back of her head that is bleeding. Pt has hx of falls. Pt uses wheelchair to get around. Denies blood thinners. EXAM: CT HEAD WITHOUT CONTRAST CT CERVICAL SPINE WITHOUT CONTRAST TECHNIQUE: Multidetector CT imaging of the head and cervical spine was performed following the standard protocol without intravenous contrast. Multiplanar CT image reconstructions of the cervical spine were also generated. COMPARISON:  03/05/2019 FINDINGS: CT HEAD FINDINGS Brain: No evidence of acute infarction, hemorrhage, hydrocephalus, extra-axial collection or mass lesion/mass effect. There is ventricular and sulcal enlargement reflecting mild diffuse atrophy. Patchy white  matter hypoattenuation is noted bilaterally consistent with moderate chronic microvascular ischemic change. Vascular: No hyperdense vessel or unexpected calcification. Skull: Normal. Negative for fracture or focal lesion. Sinuses/Orbits: Globes and orbits are unremarkable. Visualized sinuses and mastoid air cells are clear. Other: Right parietal scalp hematoma. CT CERVICAL SPINE FINDINGS Alignment: Mild reversal the normal cervical lordosis, apex at C5. No spondylolisthesis. Skull base and vertebrae: No acute fracture. No primary bone lesion or focal pathologic process. Soft tissues and spinal canal: No prevertebral fluid or swelling. No visible canal hematoma. Disc levels: Moderate loss of disc height at C3-C4. Moderate to marked loss of disc height at C4-C5, C5-C6 and C6-C7. Spondylotic disc bulging with endplate spurring noted at these levels. No convincing disc herniation. There are facet degenerative changes greatest at C3-C4. Upper chest: No acute findings. Other: None. IMPRESSION: HEAD CT 1. No acute intracranial abnormalities. 2. No skull fracture. 3. Right parietal scalp hematoma. CERVICAL CT 1. No fracture or acute finding. Electronically Signed   By: Amie Portlandavid  Ormond M.D.   On: 04/18/2019 19:25    Procedures .Marland Kitchen.Laceration Repair  Date/Time: 04/18/2019 9:35 PM Performed by: Ignacia PalmaKuefler, Amiylah Anastos S, MD Authorized by: Sabas SousBero, Michael M, MD   Laceration details:    Location:  Scalp   Scalp location:  Occipital (R)   Length (cm):  2 Repair type:    Repair type:  Simple Exploration:    Hemostasis achieved with:  Direct pressure Treatment:    Area cleansed with:  Saline   Irrigation solution:  Sterile saline Skin repair:    Repair method:  Tissue adhesive Post-procedure details:    Dressing:  Non-adherent dressing   Patient tolerance of procedure:  Tolerated well, no immediate complications   (including critical care time)  Medications Ordered in ED Medications - No data to  display   Initial Impression / Assessment and Plan / ED Course  I have reviewed the triage vital signs and the nursing notes.  Pertinent labs & imaging results that were available during my care of the patient were reviewed by me and considered in my medical decision making (see chart for details).       Concern for possible intracranial  injury given patient's fall.  Patient's hematoma and small scalp laceration are hemostatic at this time.  CT of the head and C-spine unrevealing.  No other deformity or tenderness noted on head to toe physical exam.  Patient's wound was irrigated and repaired with Dermabond.  Patient is at her baseline mental status per nursing home.  Patient denies any complaints currently.  Patient stable to discharge back to memory care facility.   Patient seen and plan discussed with Dr. Pilar PlateBero.  Final Clinical Impressions(s) / ED Diagnoses   Final diagnoses:  Laceration of scalp, initial encounter  Fall, initial encounter    ED Discharge Orders    None       Mazy Culton, Philmore PaliKatherine S, MD 04/19/19 0018    Sabas SousBero, Michael M, MD 04/19/19 2110

## 2019-04-18 NOTE — ED Notes (Signed)
Called ptar for pt  

## 2019-07-21 ENCOUNTER — Emergency Department (HOSPITAL_COMMUNITY): Payer: Medicare Other

## 2019-07-21 ENCOUNTER — Inpatient Hospital Stay (HOSPITAL_COMMUNITY)
Admission: EM | Admit: 2019-07-21 | Discharge: 2019-07-24 | DRG: 872 | Disposition: A | Discharge status: Hospice | Payer: Medicare Other | Source: Skilled Nursing Facility | Attending: Internal Medicine | Admitting: Internal Medicine

## 2019-07-21 ENCOUNTER — Encounter (HOSPITAL_COMMUNITY): Payer: Self-pay | Admitting: Emergency Medicine

## 2019-07-21 DIAGNOSIS — Z79890 Hormone replacement therapy (postmenopausal): Secondary | ICD-10-CM | POA: Diagnosis not present

## 2019-07-21 DIAGNOSIS — Z9181 History of falling: Secondary | ICD-10-CM

## 2019-07-21 DIAGNOSIS — E785 Hyperlipidemia, unspecified: Secondary | ICD-10-CM | POA: Diagnosis present

## 2019-07-21 DIAGNOSIS — F0390 Unspecified dementia without behavioral disturbance: Secondary | ICD-10-CM | POA: Diagnosis present

## 2019-07-21 DIAGNOSIS — E039 Hypothyroidism, unspecified: Secondary | ICD-10-CM | POA: Diagnosis present

## 2019-07-21 DIAGNOSIS — D649 Anemia, unspecified: Secondary | ICD-10-CM | POA: Diagnosis present

## 2019-07-21 DIAGNOSIS — Z79899 Other long term (current) drug therapy: Secondary | ICD-10-CM | POA: Diagnosis not present

## 2019-07-21 DIAGNOSIS — T68XXXA Hypothermia, initial encounter: Secondary | ICD-10-CM | POA: Diagnosis present

## 2019-07-21 DIAGNOSIS — S01111A Laceration without foreign body of right eyelid and periocular area, initial encounter: Secondary | ICD-10-CM

## 2019-07-21 DIAGNOSIS — Z515 Encounter for palliative care: Secondary | ICD-10-CM | POA: Diagnosis present

## 2019-07-21 DIAGNOSIS — S0101XA Laceration without foreign body of scalp, initial encounter: Secondary | ICD-10-CM | POA: Diagnosis present

## 2019-07-21 DIAGNOSIS — M4312 Spondylolisthesis, cervical region: Secondary | ICD-10-CM | POA: Diagnosis present

## 2019-07-21 DIAGNOSIS — R296 Repeated falls: Secondary | ICD-10-CM | POA: Diagnosis present

## 2019-07-21 DIAGNOSIS — Z7982 Long term (current) use of aspirin: Secondary | ICD-10-CM | POA: Diagnosis not present

## 2019-07-21 DIAGNOSIS — H269 Unspecified cataract: Secondary | ICD-10-CM | POA: Diagnosis present

## 2019-07-21 DIAGNOSIS — Y92092 Bedroom in other non-institutional residence as the place of occurrence of the external cause: Secondary | ICD-10-CM | POA: Diagnosis not present

## 2019-07-21 DIAGNOSIS — I959 Hypotension, unspecified: Secondary | ICD-10-CM | POA: Diagnosis present

## 2019-07-21 DIAGNOSIS — Z87820 Personal history of traumatic brain injury: Secondary | ICD-10-CM | POA: Diagnosis not present

## 2019-07-21 DIAGNOSIS — Z20828 Contact with and (suspected) exposure to other viral communicable diseases: Secondary | ICD-10-CM | POA: Diagnosis present

## 2019-07-21 DIAGNOSIS — I700 Atherosclerosis of aorta: Secondary | ICD-10-CM | POA: Diagnosis present

## 2019-07-21 DIAGNOSIS — R680 Hypothermia, not associated with low environmental temperature: Secondary | ICD-10-CM | POA: Diagnosis not present

## 2019-07-21 DIAGNOSIS — Z66 Do not resuscitate: Secondary | ICD-10-CM | POA: Diagnosis present

## 2019-07-21 DIAGNOSIS — R410 Disorientation, unspecified: Secondary | ICD-10-CM

## 2019-07-21 DIAGNOSIS — W19XXXA Unspecified fall, initial encounter: Secondary | ICD-10-CM

## 2019-07-21 DIAGNOSIS — R7989 Other specified abnormal findings of blood chemistry: Secondary | ICD-10-CM | POA: Diagnosis present

## 2019-07-21 DIAGNOSIS — I10 Essential (primary) hypertension: Secondary | ICD-10-CM | POA: Diagnosis present

## 2019-07-21 DIAGNOSIS — A419 Sepsis, unspecified organism: Secondary | ICD-10-CM | POA: Diagnosis present

## 2019-07-21 DIAGNOSIS — W06XXXA Fall from bed, initial encounter: Secondary | ICD-10-CM | POA: Diagnosis present

## 2019-07-21 DIAGNOSIS — Y92122 Bedroom in nursing home as the place of occurrence of the external cause: Secondary | ICD-10-CM

## 2019-07-21 LAB — CBC WITH DIFFERENTIAL/PLATELET
Abs Immature Granulocytes: 0.01 10*3/uL (ref 0.00–0.07)
Basophils Absolute: 0 10*3/uL (ref 0.0–0.1)
Basophils Relative: 0 %
Eosinophils Absolute: 0 10*3/uL (ref 0.0–0.5)
Eosinophils Relative: 1 %
HCT: 40.4 % (ref 36.0–46.0)
Hemoglobin: 13.1 g/dL (ref 12.0–15.0)
Immature Granulocytes: 0 %
Lymphocytes Relative: 16 %
Lymphs Abs: 0.9 10*3/uL (ref 0.7–4.0)
MCH: 29.1 pg (ref 26.0–34.0)
MCHC: 32.4 g/dL (ref 30.0–36.0)
MCV: 89.8 fL (ref 80.0–100.0)
Monocytes Absolute: 0.5 10*3/uL (ref 0.1–1.0)
Monocytes Relative: 9 %
Neutro Abs: 4.2 10*3/uL (ref 1.7–7.7)
Neutrophils Relative %: 74 %
Platelets: 162 10*3/uL (ref 150–400)
RBC: 4.5 MIL/uL (ref 3.87–5.11)
RDW: 13.5 % (ref 11.5–15.5)
WBC: 5.6 10*3/uL (ref 4.0–10.5)
nRBC: 0 % (ref 0.0–0.2)

## 2019-07-21 LAB — URINALYSIS, ROUTINE W REFLEX MICROSCOPIC
Bilirubin Urine: NEGATIVE
Glucose, UA: >=500 mg/dL — AB
Hgb urine dipstick: NEGATIVE
Ketones, ur: NEGATIVE mg/dL
Leukocytes,Ua: NEGATIVE
Nitrite: POSITIVE — AB
Protein, ur: NEGATIVE mg/dL
Specific Gravity, Urine: 1.012 (ref 1.005–1.030)
pH: 7 (ref 5.0–8.0)

## 2019-07-21 LAB — PROTIME-INR
INR: 1 (ref 0.8–1.2)
Prothrombin Time: 12.9 seconds (ref 11.4–15.2)

## 2019-07-21 LAB — COMPREHENSIVE METABOLIC PANEL
ALT: 156 U/L — ABNORMAL HIGH (ref 0–44)
AST: 99 U/L — ABNORMAL HIGH (ref 15–41)
Albumin: 3.5 g/dL (ref 3.5–5.0)
Alkaline Phosphatase: 98 U/L (ref 38–126)
Anion gap: 15 (ref 5–15)
BUN: 23 mg/dL (ref 8–23)
CO2: 30 mmol/L (ref 22–32)
Calcium: 10.2 mg/dL (ref 8.9–10.3)
Chloride: 99 mmol/L (ref 98–111)
Creatinine, Ser: 0.63 mg/dL (ref 0.44–1.00)
GFR calc Af Amer: >60 mL/min (ref >60)
GFR calc non Af Amer: >60 mL/min (ref >60)
Glucose, Bld: 118 mg/dL — ABNORMAL HIGH (ref 70–99)
Potassium: 3.6 mmol/L (ref 3.5–5.1)
Sodium: 144 mmol/L (ref 135–145)
Total Bilirubin: 0.3 mg/dL (ref 0.3–1.2)
Total Protein: 6.8 g/dL (ref 6.5–8.1)

## 2019-07-21 LAB — URINE CULTURE

## 2019-07-21 LAB — LACTIC ACID, PLASMA: Lactic Acid, Venous: 3.7 mmol/L (ref 0.5–1.9)

## 2019-07-21 LAB — RAPID URINE DRUG SCREEN, HOSP PERFORMED
Amphetamines: NOT DETECTED
Barbiturates: NOT DETECTED
Benzodiazepines: NOT DETECTED
Cocaine: NOT DETECTED
Opiates: NOT DETECTED
Tetrahydrocannabinol: NOT DETECTED

## 2019-07-21 LAB — CBG MONITORING, ED: Glucose-Capillary: 117 mg/dL — ABNORMAL HIGH (ref 70–99)

## 2019-07-21 LAB — CK: Total CK: 342 U/L — ABNORMAL HIGH (ref 38–234)

## 2019-07-21 LAB — POC SARS CORONAVIRUS 2 AG -  ED: SARS Coronavirus 2 Ag: NEGATIVE

## 2019-07-21 LAB — TSH: TSH: 6.326 u[IU]/mL — ABNORMAL HIGH (ref 0.350–4.500)

## 2019-07-21 LAB — APTT: aPTT: 37 seconds — ABNORMAL HIGH (ref 24–36)

## 2019-07-21 LAB — SARS CORONAVIRUS 2 (TAT 6-24 HRS): SARS Coronavirus 2: NEGATIVE

## 2019-07-21 MED ORDER — ONDANSETRON HCL 4 MG/2ML IJ SOLN: 4.0000 mg | Freq: Four times a day (QID) | INTRAMUSCULAR | Status: DC | PRN

## 2019-07-21 MED ORDER — SODIUM CHLORIDE 0.9 % IV BOLUS
500.0000 mL | Freq: Once | INTRAVENOUS | Status: AC
  Administered 2019-07-21: 500 mL via INTRAVENOUS

## 2019-07-21 MED ORDER — SODIUM CHLORIDE 0.9 % IV BOLUS
1000.0000 mL | Freq: Once | INTRAVENOUS | Status: AC
  Administered 2019-07-21: 1000 mL via INTRAVENOUS

## 2019-07-21 MED ORDER — POTASSIUM CHLORIDE 2 MEQ/ML IV SOLN
INTRAVENOUS | Status: DC
  Filled 2019-07-21: qty 1000

## 2019-07-21 MED ORDER — VANCOMYCIN HCL 750 MG/150ML IV SOLN: 750.0000 mg | INTRAVENOUS | Status: DC

## 2019-07-21 MED ORDER — ACETAMINOPHEN 650 MG RE SUPP: 650.0000 mg | Freq: Four times a day (QID) | RECTAL | Status: DC | PRN

## 2019-07-21 MED ORDER — ENOXAPARIN SODIUM 40 MG/0.4ML ~~LOC~~ SOLN: 40.0000 mg | SUBCUTANEOUS | Status: DC

## 2019-07-21 MED ORDER — GLYCOPYRROLATE 0.2 MG/ML IJ SOLN
0.4000 mg | Freq: Four times a day (QID) | INTRAMUSCULAR | Status: DC
  Administered 2019-07-21 – 2019-07-24 (×11): 0.4 mg via INTRAVENOUS
  Filled 2019-07-21 (×12): qty 2

## 2019-07-21 MED ORDER — LEVOTHYROXINE SODIUM 75 MCG PO TABS: 75.0000 ug | ORAL_TABLET | Freq: Every day | ORAL | Status: DC

## 2019-07-21 MED ORDER — MORPHINE SULFATE (PF) 2 MG/ML IV SOLN
2.0000 mg | INTRAVENOUS | Status: DC | PRN
  Administered 2019-07-23 – 2019-07-24 (×2): 2 mg via INTRAVENOUS
  Filled 2019-07-21 (×2): qty 1

## 2019-07-21 MED ORDER — LIDOCAINE-EPINEPHRINE-TETRACAINE (LET) TOPICAL GEL
3.0000 mL | Freq: Once | TOPICAL | Status: DC
  Filled 2019-07-21: qty 3

## 2019-07-21 MED ORDER — GLYCOPYRROLATE 0.2 MG/ML IJ SOLN
0.4000 mg | INTRAMUSCULAR | Status: DC | PRN
  Administered 2019-07-23: 0.4 mg via INTRAVENOUS

## 2019-07-21 MED ORDER — VANCOMYCIN HCL IN DEXTROSE 1-5 GM/200ML-% IV SOLN
1000.0000 mg | Freq: Once | INTRAVENOUS | Status: AC
  Administered 2019-07-21: 04:00:00 1000 mg via INTRAVENOUS
  Filled 2019-07-21: qty 200

## 2019-07-21 MED ORDER — GLYCOPYRROLATE 0.2 MG/ML IJ SOLN
0.4000 mg | INTRAMUSCULAR | Status: AC
  Administered 2019-07-21: 0.4 mg via INTRAVENOUS
  Filled 2019-07-21: qty 2

## 2019-07-21 MED ORDER — ONDANSETRON HCL 4 MG PO TABS: 4.0000 mg | ORAL_TABLET | Freq: Four times a day (QID) | ORAL | Status: DC | PRN

## 2019-07-21 MED ORDER — DIVALPROEX SODIUM 125 MG PO DR TAB
125.0000 mg | DELAYED_RELEASE_TABLET | Freq: Two times a day (BID) | ORAL | Status: DC
  Filled 2019-07-21: qty 1

## 2019-07-21 MED ORDER — MORPHINE SULFATE (PF) 2 MG/ML IV SOLN
2.0000 mg | Freq: Four times a day (QID) | INTRAVENOUS | Status: DC
  Administered 2019-07-21 – 2019-07-24 (×13): 2 mg via INTRAVENOUS
  Filled 2019-07-21 (×13): qty 1

## 2019-07-21 MED ORDER — SODIUM CHLORIDE 0.9 % IV BOLUS
30.0000 mL/kg | Freq: Once | INTRAVENOUS | Status: AC
  Administered 2019-07-21: 1428 mL via INTRAVENOUS

## 2019-07-21 MED ORDER — ACETAMINOPHEN 325 MG PO TABS: 650.0000 mg | ORAL_TABLET | Freq: Four times a day (QID) | ORAL | Status: DC | PRN

## 2019-07-21 MED ORDER — SODIUM CHLORIDE 0.9 % IV SOLN
2.0000 g | Freq: Once | INTRAVENOUS | Status: AC
  Administered 2019-07-21: 04:00:00 2 g via INTRAVENOUS
  Filled 2019-07-21: qty 2

## 2019-07-21 MED ORDER — SODIUM CHLORIDE 0.9 % IV SOLN: 2.0000 g | Freq: Two times a day (BID) | INTRAVENOUS | Status: DC

## 2019-07-21 MED ORDER — LORAZEPAM 2 MG/ML IJ SOLN
1.0000 mg | Freq: Four times a day (QID) | INTRAMUSCULAR | Status: DC
  Administered 2019-07-21 – 2019-07-24 (×13): 1 mg via INTRAVENOUS
  Filled 2019-07-21 (×13): qty 1

## 2019-07-21 MED ORDER — LORAZEPAM 2 MG/ML IJ SOLN: 1.0000 mg | INTRAMUSCULAR | Status: DC | PRN

## 2019-07-21 MED ORDER — METRONIDAZOLE IN NACL 5-0.79 MG/ML-% IV SOLN
500.0000 mg | Freq: Once | INTRAVENOUS | Status: AC
  Administered 2019-07-21: 04:00:00 500 mg via INTRAVENOUS
  Filled 2019-07-21: qty 100

## 2019-07-21 NOTE — ED Provider Notes (Signed)
Patient signed out to me awaiting critical care consultation for possible admission given hypotension in the setting of likely infectious process.  Urinalysis looks consistent with UTI.  Patient has gotten antibiotics.  30 cc/kg of IV fluids are ordered and being given.  Blood pressure slowly improving.  Patient came in hypothermic in the 80s but temperature now 94.2.  Patient per paperwork from facility is still full code.  Overnight ED team has not been able to get in touch with family.  I was able to discuss case with patient's husband.  It sounds like patient would want to be DNR.  Therefore patient has been made DNR.  Patient has been given additional fluid boluses and blood pressure is down to 90 systolic.  She withdraws the pain which is fairly close to her baseline.  Will admit to medicine service for further care.  Likely sepsis from UTI.  Patient was found supposedly down in her room by people at her nursing facility.  Has a right forehead laceration that has been repaired.  Urinary output has increased while under my care as well.  We will not do any life-saving interventions including vasopressors, CPR, intubation.  Patient likely needs palliative care consultation.  I have talked to both critical care team and admitting team.  Goals of care discussions have also been discussed with family member.  This chart was dictated using voice recognition software.  Despite best efforts to proofread,  errors can occur which can change the documentation meaning.   .Critical Care Performed by: Lennice Sites, DO Authorized by: Lennice Sites, DO   Critical care provider statement:    Critical care time (minutes):  35   Critical care was necessary to treat or prevent imminent or life-threatening deterioration of the following conditions:  Sepsis   Critical care was time spent personally by me on the following activities:  Development of treatment plan with patient or surrogate, discussions with  consultants, blood draw for specimens, discussions with primary provider, evaluation of patient's response to treatment, obtaining history from patient or surrogate, ordering and performing treatments and interventions, ordering and review of laboratory studies, ordering and review of radiographic studies, pulse oximetry, re-evaluation of patient's condition and review of old charts   I assumed direction of critical care for this patient from another provider in my specialty: yes         Lennice Sites, DO 07/21/19 [phone]

## 2019-07-21 NOTE — H&P (Signed)
Date: 07/21/2019               Patient Name:  Claire Hoffman MRN: [id number]  DOB: [birth year] Age / Sex: 78 y.o., female   PCP: Ron Parker, MD         Medical Service: Internal Medicine Teaching Service         Attending Physician: Dr. Inez Catalina, MD    First Contact: Dr. Ephriam Knuckles  Pager: [phone]  Second Contact: Dr. Cleaster Corin Pager: [phone]       After Hours (After 5p/  First Contact Pager: [phone]  weekends / holidays): Second Contact Pager: [phone]   Chief Complaint: Fall  History of Present Illness: Rielyn Krupinski is a 78 y.o female with advanced dementia, hypertension, hyperlipidemia, and hypothyroidism who presented to the emergency department after sustaining a full from bed. The patient is nonverbal at baseline and therefore history was obtained via chart review and by her husband Amarachukwu Lakatos.   Patient was brought to the emergency department from [facility] after the patient was found beside her bed. She appeared to have struck her head on the dresser. When EMS arrived she was found to be hypothermic and hypotensive. Code sepsis was initiated an emergency department. She was ballast 30 cc/kg of IV fluids however remained hypotensive. The ED was able to contact husband discussed her care with the husband. The husband mentioned that she is a DNR. They would not want based on pressers, CPR, or intubation.   I had a very long discussion with husband. He states that Daylynn Stumpp previously worked for [employer] for over 30 years. She has three children who are all alive and healthy. Previously she was a very active person and enjoyed being outdoors. 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 They subsequently noticed a significant decline in her cognitive abilities. She was evaluated by neurology and had an extensive workup and told that she was showing signs of dementia. She was previously living at home until this past May when she moved in to  [facility]. Since that time she has had a significant decline in her mental status. She is now dependent on all of her ADLs. She is pretty much nonverbal. He states that she would not want to live like this and he carries guilt for the way that she lives. He again reiterates that she would not want Vasopressors, CPR, intubation, antibiotics, or IV fluids. They do not have the most form currently. He would like to focus more on comfort.  Meds:  No current facility-administered medications on file prior to encounter.   Current Outpatient Medications on File Prior to Encounter  Medication Sig Dispense Refill  . amLODipine (NORVASC) 10 MG tablet Take 1 tablet (10 mg total) by mouth daily. 30 tablet 0  . aspirin 81 MG chewable tablet Chew 81 mg by mouth daily.    Marland Kitchen atorvastatin (LIPITOR) 10 MG tablet Take 10 mg by mouth daily.    . divalproex (DEPAKOTE) 125 MG DR tablet Take 250 mg by mouth 2 (two) times daily.     . ferrous sulfate 325 (65 FE) MG tablet Take 1 tablet (325 mg total) by mouth daily with breakfast. 30 tablet 0  . haloperidol (HALDOL) 5 MG tablet Take 5 mg by mouth 3 (three) times daily as needed for agitation.    Marland Kitchen levothyroxine (SYNTHROID) 75 MCG tablet Take 1 tablet (75 mcg total) by mouth daily at 6 (six) AM. 30 tablet 0  .  Multiple Vitamin (MULTIVITAMIN WITH MINERALS) TABS tablet Take 1 tablet by mouth daily. 30 tablet 0  . Nutritional Supplements (NUTRITIONAL SHAKE PO) Take 1 Can by mouth 2 (two) times daily. Mighty shake twice daily at 8 am and 12 pm.    . traZODone (DESYREL) 50 MG tablet Take 50 mg by mouth at bedtime.    . vitamin C (VITAMIN C) 500 MG tablet Take 1 tablet (500 mg total) by mouth 2 (two) times daily. 60 tablet 0   Allergies: Allergies as of 07/21/2019  . (No Known Allergies)   History reviewed. No pertinent past medical history.  Family History: Unable to obtain due to the patient's current mental status.  Social History: Unable to obtain due to the  patient's current mental status.  Review of Systems: Unable to obtain due to the patient's current mental status.  Physical Exam: Blood pressure (!) 104/52, pulse 99, temperature (!) 96.7 F (35.9 C), resp. rate 17, height 5\' 4"  (1.626 m), weight 50.4 kg, SpO2 95 %.  General: Elderly female in no acute distress HENT: Ecchymosis around the right eye with a repaired laceration on the right forehead Pulm: Good air movement with upper airway rhonchi  CV: RRR, no murmurs, no rubs  Abdomen: Active bowel sounds, soft, non-distended, no tenderness to palpation  Extremities: Pulses palpable in all extremities, no LE edema  Skin: Warm and dry  Neuro: Nonverbal, withdrawals to pain  EKG: personally reviewed my interpretation is sinus bradycardia without any acute ST segment changes.  CXR: personally reviewed my interpretation is good inspiration, penetration, but slightly rotated. Aortic arch atherosclerosis. No other bony or soft tissue abnormalities. No mediastinal widening. No enlargement of the cardiac silhouette. No pleural effusions. No obvious infiltrates or opacities.  Assessment & Plan by Problem: Active Problems:   Sepsis ([place])  Imanie Darrow is a 78 y.o female with advanced dementia, hypertension, hyperlipidemia, and hypothyroidism who presented to the emergency department after sustaining a full from bed. She was found to be hypothermic and hypotensive. The code sepsis was initiated she was given IV fluids along with broad-spectrum antibiotics. Upon further discussions with her husband Roan Sawchuk he would like to focus more on comfort care as outlined in HPI.  GOC - No vasopressors, CPR, intubation, antibiotics, or IVF  - Limit lab draws  - Please allow Ron Dennard Schaumann to be at bedside, order has been placed.  - Will stop all nonessential medications   Diet: Eating for comfort  VTE ppx: End of life CODE STATUS: DNR  Dispo: Admit patient to Inpatient with expected length of  stay greater than 2 midnights.  SignedIna Homes, MD 07/21/2019, 10:25 AM  Pager: [phone]

## 2019-07-21 NOTE — Consult Note (Signed)
NAME:  Claire Hoffman, MRN:  [id number], DOB:  Jan 29, 1941, LOS: 0 ADMISSION DATE:  07/21/2019, CONSULTATION DATE:  07/21/2019 REFERRING MD:  Randal Buba - EM, CHIEF COMPLAINT:  Fall   Brief History   78 yo F, reportedly non-verbal with dementia, presents to ED s/p fall.   History of present illness   77 yo F PMH Dementia, HTN, HLD, hypothyroidism, Anemia presents to ED from assisted living [facility]) after sustaining fall from bed. Of note, patient seen in [place] in July 2020 for fall. Patient is reportedly non-verbal at baseline, and current mentation is reportedly consistent with baseline.  Patient is hypothermic in ED with recorded temperatures ranging 82.2 to 90.7. Patient is hypotensive but MAPS have largely sustained over 60. ED labs are largely WNL, without electrolyte disturbance or leukocytosis. Lactic acid is marginally elevated to 3.7. UA reveals turbid urine, neg UrBili, UrGlu > 500, neg Ur hgb, neg urKetone, neg UrLeuks, Positive UrNitrites, rare bacteria.   Started on ED sepsis protocol, initiating broad spectrum abx to include vanc cefepime flagyl and receiving 1-2 L fluid in ED.   CT Head and Cspine reveal no acute intracranial injury, Large R sided scalp hematoma without fracture, and no acute C spine abnormalities but does show cervical spine degeneration without fracture.   Past Medical History  HTN HLD Hypothyroidism Dementia  Significant Hospital Events   12/17 presents to ED after sustaining fall   Consults:  PCCM  Procedures:    Significant Diagnostic Tests:  12/17 CT head, CT c spine> no acute intracranial abnormality. R sided scalp and facial hematoma without fracture. Mild degenerative changes to C3-4, C7-T1. Cervical degenerative disc narrowing.  12/17 CXR> no acute findings   Micro Data:  12/17 SARS Cov2 >>> 12/17 UCx>>> 12/17 BCx>>>   Antimicrobials:  12/17 Vanc> 12/17 Cefepime> 12/17 Flagyl>  Interim history/subjective:  Presented to  ED and is hypothermic. Bair hugger applied by EDP.   Objective   Blood pressure (!) 72/49, pulse (!) 58, temperature (!) 90.3 F (32.4 C), resp. rate 13, height 5\' 4"  (1.626 m), weight 50.4 kg, SpO2 97 %.       No intake or output data in the 24 hours ending 07/21/19 0709 Filed Weights   07/21/19 0402 07/21/19 0618  Weight: 47.6 kg 50.4 kg    Examination: General: chronically ill appearing elderly F. Supine in bed, NAD.  HENT: R sided hematoma, R sided supraorbital lac, R sided periorbital edema and ecchymosis. C collar in place. Anicteric sclera. Trachea midline  Lungs: CTA bilaterally. Symmetrical chest expansion. No accessory muscle use on RA.  Cardiovascular: RRR s1s2 no rgm. Cap refill < 3 seconds BUE BLE Abdomen: Soft flat ndnt. No guarding. + bowel sounds x4 Extremities: Symmetrical bulk and tone. No obvious joint deformity. No cyanosis or clubbing  Neuro: Grimaces to painful stimuli. Does not follow commands. Pupils 67mm, ERRL  GU: foley   Resolved Hospital Problem list     Assessment & Plan:   S/p Fall - R facial laceration R scalp hematoma P -recommend continuing [place] conversations as patient has had recurring falls, seen also in July for such an incident -Continue c-collar for now -- CT c spine without acute abnormality but difficult to clear with non-verbal patient  -check CK   Hypotension -2L given in ED -SPB are <90 but MAPs grossly above 60 -With hx non-verbal dementia it is difficult to assess mental status implications of hypotension  Elevated lactic acid -likely related to down time from fall, possible  component of hypotension as well P -continue volume resusc; patient famiyl does not want pressors  -trend UOP  -MAP goal 60-65 -Do not see great utility in trending lactic acid   Dementia -reportedly non-verbal and baseline P -Supportive Care -Delirium Precautions   Concern for Sepsis -AMS in ED -broad abx started and cx sent -no fever, no  leukocytosis  P -follow up micro data -low threshold to dc abx  -Trend WBC, fever curve   Hypothermia P -Bair hugger, goal normothermia  Hx HTN -tele  Hx Hypothermia -rec continuing home synthroid    Best practice:  Diet: NPO Pain/Anxiety/Delirium protocol (if indicated): na VAP protocol (if indicated): na Code Status: DNR (no CPR, no CV, no intubation, no utilization of pressors for hypotension and no ACLS meds)  Family Communication: EDP has spoken with husband Disposition: Ok for admission to SDU. Patient care limitations include DNR/I, no pressors. Recommend attempting volume resuscitation and continuing GOC conversations with family if patient does not improve.   Labs   CBC: Recent Labs  Lab 07/21/19 0416  WBC 5.6  NEUTROABS 4.2  HGB 13.1  HCT 40.4  MCV 89.8  PLT 162    Basic Metabolic Panel: Recent Labs  Lab 07/21/19 0416  NA 144  K 3.6  CL 99  CO2 30  GLUCOSE 118*  BUN 23  CREATININE 0.63  CALCIUM 10.2   GFR: Estimated Creatinine Clearance: 46.1 mL/min (by C-G formula based on SCr of 0.63 mg/dL). Recent Labs  Lab 07/21/19 0416 07/21/19 0422  WBC 5.6  --   LATICACIDVEN  --  3.7*    Liver Function Tests: Recent Labs  Lab 07/21/19 0416  AST 99*  ALT 156*  ALKPHOS 98  BILITOT 0.3  PROT 6.8  ALBUMIN 3.5   No results for input(s): LIPASE, AMYLASE in the last 168 hours. No results for input(s): AMMONIA in the last 168 hours.  ABG No results found for: PHART, PCO2ART, PO2ART, HCO3, TCO2, ACIDBASEDEF, O2SAT   Coagulation Profile: Recent Labs  Lab 07/21/19 0416  INR 1.0    Cardiac Enzymes: No results for input(s): CKTOTAL, CKMB, CKMBINDEX, TROPONINI in the last 168 hours.  HbA1C: No results found for: HGBA1C  CBG: Recent Labs  Lab 07/21/19 0328  GLUCAP 117*    Review of Systems:   Unable to obtain, baseline non-verbal   Past Medical History  She,  has no past medical history on file.   Surgical History   History  reviewed. No pertinent surgical history.   Social History   reports that she has never smoked. She has never used smokeless tobacco.   Family History   Her family history is not on file.   Allergies No Known Allergies   Home Medications  Prior to Admission medications   Medication Sig Start Date End Date Taking? Authorizing Provider  amLODipine (NORVASC) 10 MG tablet Take 1 tablet (10 mg total) by mouth daily. 03/13/19   Darlin Drop, DO  aspirin 81 MG chewable tablet Chew 81 mg by mouth daily.    [provider]  atorvastatin (LIPITOR) 10 MG tablet Take 10 mg by mouth daily.    [provider]  divalproex (DEPAKOTE) 125 MG DR tablet Take 125 mg by mouth 2 (two) times daily.    [provider]  ferrous sulfate 325 (65 FE) MG tablet Take 1 tablet (325 mg total) by mouth daily with breakfast. 03/13/19   Darlin Drop, DO  haloperidol (HALDOL) 5 MG tablet Take 5  mg by mouth 3 (three) times daily as needed for agitation.    [provider]  levothyroxine (SYNTHROID) 75 MCG tablet Take 1 tablet (75 mcg total) by mouth daily at 6 (six) AM. 03/13/19   Darlin DropHall, Carole N, DO  Multiple Vitamin (MULTIVITAMIN WITH MINERALS) TABS tablet Take 1 tablet by mouth daily. 03/13/19   Darlin DropHall, Carole N, DO  traZODone (DESYREL) 50 MG tablet Take 50 mg by mouth at bedtime.    [provider]  vitamin C (VITAMIN C) 500 MG tablet Take 1 tablet (500 mg total) by mouth 2 (two) times daily. 03/12/19   Darlin DropHall, Carole N, DO     Tessie FassGrace Ranard Harte MSN, AGACNP-BC [facility] [phone] If no answer, [phone] 07/21/2019, 8:10 AM

## 2019-07-21 NOTE — Progress Notes (Signed)
Pharmacy Antibiotic Note  Claire Hoffman is a 78 y.o. female admitted on 07/21/2019 with fall.  Pharmacy has been consulted for Vancomycin/Cefepime dosing for r/o sepsis. WBC WNL. Renal function age appropriate. Lactic acid elevated.   Plan: Vancomycin 750 mg IV q24h >>Estimated AUC: 540 Cefepime 2g IV q12h Trend WBC, temp, renal function  F/U infectious work-up Drug levels as indicated   Height: 5\' 4"  (162.6 cm) Weight: 105 lb (47.6 kg) IBW/kg (Calculated) : 54.7  Temp (24hrs), Avg:84.3 F (29.1 C), Min:81.7 F (27.6 C), Max:88.2 F (31.2 C)  Recent Labs  Lab 07/21/19 0416 07/21/19 0422  WBC 5.6  --   CREATININE 0.63  --   LATICACIDVEN  --  3.7*    Estimated Creatinine Clearance: 43.6 mL/min (by C-G formula based on SCr of 0.63 mg/dL).    No Known Allergies  Narda Bonds, PharmD, BCPS Clinical Pharmacist Phone: [phone]

## 2019-07-21 NOTE — Social Work (Signed)
CSW acknowledging pt from [place]- current plan for comfort care. Will follow for disposition should hospice services or residential hospice become appropriate.   Alexander Mt, [place] Work [phone]

## 2019-07-21 NOTE — ED Notes (Signed)
This RN spoke with husband who is on his way here.

## 2019-07-21 NOTE — ED Notes (Signed)
Pt has foley catheter in place by previous shift. Temperature monitored via foley.

## 2019-07-21 NOTE — Progress Notes (Signed)
Palliative Medicine RN Note: Consult order noted. Patient is already comfort care per teaching service; husband is on the way here.  PMT evaluated patient in the ED. Discussed the case with Dr Hilma Favors, who was familiar with Claire Hoffman from her residency. We were hopeful that Claire Hoffman would be a good candidate for residential hospice, but at the time of my assessment, transfer would not be appropriate.  Claire. Hoffman is having copious oral secretions that were significantly cleared by raising HOB and gentle oral suctioning. Dr Hilma Favors also ordered stat Robinul until further orders were initiated (scheduled and prn comfort meds).  Dr Darrick Meigs and Dr Daryll Drown with teaching service met me at bedside, and we discussed the orders Dr Hilma Favors initiated. Pt was hypothermic upon arrival, and barehugger blanket remained on the patient (not plugged in), and the room was warm. Dr Darrick Meigs clearned Claire Hoffman's facial wound and bloody tears, and I removed the bairhugger/sweaty blankets. It is expected that her temperature would be labile at end of life, and adjusting room temperature and bedding as needed is the simplest intervention.   PMT will follow up tomorrow, after Claire Hoffman is hopefully settled in a regular room and comfortable.   If patient remains symptomatic despite maximum doses, please call PMT at [phone] between 0700 and 1900. Outside of these hours, please call attending, as PMT does not have night coverage.  Claire Skiff Geniece Akers, RN, BSN, [facility] Palliative Medicine Team 07/21/2019 1:15 PM Office [phone]

## 2019-07-21 NOTE — ED Notes (Signed)
Husband updated on patient's admitting bed.

## 2019-07-21 NOTE — ED Triage Notes (Signed)
Pt presents to ED from [place]. Per facility pt had fall off beed and hit dresser next to bed. Significant lact on front of head. Pupils PERRLA. Pt at baseline - nonverbal, confused. EMS VSS

## 2019-07-21 NOTE — ED Notes (Signed)
Irie Dowson [phone] husband looking for an update on patient

## 2019-07-21 NOTE — ED Notes (Signed)
Lunch Tray Ordered @ 1117. 

## 2019-07-21 NOTE — ED Provider Notes (Signed)
[facility] Provider Note   CSN: [id number] Arrival date & time: 07/21/19  4098     History Chief Complaint  Patient presents with  . Fall    Claire Hoffman is a 78 y.o. female.  The history is limited by the condition of the patient (level 5 caveat dementia non verbal).  Fall This is a recurrent problem. The current episode started 1 to 2 hours ago (reportedly ). The problem occurs rarely. The problem has not changed since onset.Pertinent negatives include no chest pain. Nothing aggravates the symptoms. Nothing relieves the symptoms. She has tried nothing for the symptoms. The treatment provided no relief.  Patient with Dementia for reportedly fell out of bed and was down for approximately 10 minutes.       History reviewed. No pertinent past medical history.  Patient Active Problem List   Diagnosis Date Noted  . Symptomatic anemia 03/07/2019  . Anemia 03/06/2019  . HTN (hypertension) 03/06/2019  . HLD (hyperlipidemia) 03/06/2019  . Dementia ([place]) 03/06/2019    History reviewed. No pertinent surgical history.   OB History   No obstetric history on file.     History reviewed. No pertinent family history.  Social History   Tobacco Use  . Smoking status: Never Smoker  . Smokeless tobacco: Never Used  Substance Use Topics  . Alcohol use: Not on file  . Drug use: Not on file    Home Medications Prior to Admission medications   Medication Sig Start Date End Date Taking? Authorizing Provider  amLODipine (NORVASC) 10 MG tablet Take 1 tablet (10 mg total) by mouth daily. 03/13/19   Kayleen Memos, DO  aspirin 81 MG chewable tablet Chew 81 mg by mouth daily.    [provider]  atorvastatin (LIPITOR) 10 MG tablet Take 10 mg by mouth daily.    [provider]  divalproex (DEPAKOTE) 125 MG DR tablet Take 125 mg by mouth 2 (two) times daily.    [provider]  ferrous sulfate 325 (65 FE) MG tablet  Take 1 tablet (325 mg total) by mouth daily with breakfast. 03/13/19   Kayleen Memos, DO  haloperidol (HALDOL) 5 MG tablet Take 5 mg by mouth 3 (three) times daily as needed for agitation.    [provider]  levothyroxine (SYNTHROID) 75 MCG tablet Take 1 tablet (75 mcg total) by mouth daily at 6 (six) AM. 03/13/19   Kayleen Memos, DO  Multiple Vitamin (MULTIVITAMIN WITH MINERALS) TABS tablet Take 1 tablet by mouth daily. 03/13/19   Kayleen Memos, DO  traZODone (DESYREL) 50 MG tablet Take 50 mg by mouth at bedtime.    [provider]  vitamin C (VITAMIN C) 500 MG tablet Take 1 tablet (500 mg total) by mouth 2 (two) times daily. 03/12/19   Kayleen Memos, DO    Allergies    Patient has no known allergies.  Review of Systems   Review of Systems  Unable to perform ROS: Dementia  Cardiovascular: Negative for chest pain.  Skin: Positive for wound.    Physical Exam Updated Vital Signs BP 100/63   Pulse (!) 58   Temp (!) 82.6 F (28.1 C)   Resp (!) 23   Ht '5\' 4"'  (1.626 m)   Wt 47.6 kg   SpO2 100%   BMI 18.02 kg/m   Physical Exam Vitals and nursing note reviewed.  HENT:     Head:      Nose: Nose  normal.  Eyes:     Conjunctiva/sclera: Conjunctivae normal.     Pupils: Pupils are equal, round, and reactive to light.  Cardiovascular:     Rate and Rhythm: Normal rate and regular rhythm.     Pulses: Normal pulses.     Heart sounds: Normal heart sounds.  Pulmonary:     Effort: Pulmonary effort is normal.     Breath sounds: Normal breath sounds.  Abdominal:     General: Abdomen is flat. Bowel sounds are normal.     Tenderness: There is no abdominal tenderness. There is no guarding or rebound.  Musculoskeletal:        General: Normal range of motion.     Cervical back: Normal range of motion and neck supple.  Skin:    General: Skin is warm and dry.     Capillary Refill: Capillary refill takes less than 2 seconds.  Neurological:     GCS: GCS eye subscore is 4. GCS  verbal subscore is 1. GCS motor subscore is 4.     Deep Tendon Reflexes: Reflexes normal.     ED Results / Procedures / Treatments   Labs (all labs ordered are listed, but only abnormal results are displayed) Results for orders placed or performed during the hospital encounter of 07/21/19  Lactic acid, plasma  Result Value Ref Range   Lactic Acid, Venous 3.7 (HH) 0.5 - 1.9 mmol/L  Comprehensive metabolic panel  Result Value Ref Range   Sodium 144 135 - 145 mmol/L   Potassium 3.6 3.5 - 5.1 mmol/L   Chloride 99 98 - 111 mmol/L   CO2 30 22 - 32 mmol/L   Glucose, Bld 118 (H) 70 - 99 mg/dL   BUN 23 8 - 23 mg/dL   Creatinine, Ser 0.63 0.44 - 1.00 mg/dL   Calcium 10.2 8.9 - 10.3 mg/dL   Total Protein 6.8 6.5 - 8.1 g/dL   Albumin 3.5 3.5 - 5.0 g/dL   AST 99 (H) 15 - 41 U/L   ALT 156 (H) 0 - 44 U/L   Alkaline Phosphatase 98 38 - 126 U/L   Total Bilirubin 0.3 0.3 - 1.2 mg/dL   GFR calc non Af Amer >60 >60 mL/min   GFR calc Af Amer >60 >60 mL/min   Anion gap 15 5 - 15  CBC WITH DIFFERENTIAL  Result Value Ref Range   WBC 5.6 4.0 - 10.5 K/uL   RBC 4.50 3.87 - 5.11 MIL/uL   Hemoglobin 13.1 12.0 - 15.0 g/dL   HCT 40.4 36.0 - 46.0 %   MCV 89.8 80.0 - 100.0 fL   MCH 29.1 26.0 - 34.0 pg   MCHC 32.4 30.0 - 36.0 g/dL   RDW 13.5 11.5 - 15.5 %   Platelets 162 150 - 400 K/uL   nRBC 0.0 0.0 - 0.2 %   Neutrophils Relative % 74 %   Neutro Abs 4.2 1.7 - 7.7 K/uL   Lymphocytes Relative 16 %   Lymphs Abs 0.9 0.7 - 4.0 K/uL   Monocytes Relative 9 %   Monocytes Absolute 0.5 0.1 - 1.0 K/uL   Eosinophils Relative 1 %   Eosinophils Absolute 0.0 0.0 - 0.5 K/uL   Basophils Relative 0 %   Basophils Absolute 0.0 0.0 - 0.1 K/uL   Immature Granulocytes 0 %   Abs Immature Granulocytes 0.01 0.00 - 0.07 K/uL  APTT  Result Value Ref Range   aPTT 37 (H) 24 - 36 seconds  Protime-INR  Result Value  Ref Range   Prothrombin Time 12.9 11.4 - 15.2 seconds   INR 1.0 0.8 - 1.2  Urinalysis, Routine w reflex  microscopic  Result Value Ref Range   Color, Urine YELLOW YELLOW   APPearance TURBID (A) CLEAR   Specific Gravity, Urine 1.012 1.005 - 1.030   pH 7.0 5.0 - 8.0   Glucose, UA >=500 (A) NEGATIVE mg/dL   Hgb urine dipstick NEGATIVE NEGATIVE   Bilirubin Urine NEGATIVE NEGATIVE   Ketones, ur NEGATIVE NEGATIVE mg/dL   Protein, ur NEGATIVE NEGATIVE mg/dL   Nitrite POSITIVE (A) NEGATIVE   Leukocytes,Ua NEGATIVE NEGATIVE   RBC / HPF 0-5 0 - 5 RBC/hpf   WBC, UA 11-20 0 - 5 WBC/hpf   Bacteria, UA RARE (A) NONE SEEN   Squamous Epithelial / LPF 0-5 0 - 5  POC SARS Coronavirus 2 Ag-ED - Nasal Swab (BD Veritor Kit)  Result Value Ref Range   SARS Coronavirus 2 Ag NEGATIVE NEGATIVE  CBG monitoring, ED  Result Value Ref Range   Glucose-Capillary 117 (H) 70 - 99 mg/dL   DG Chest Port 1 View  Result Date: 07/21/2019 CLINICAL DATA:  Fall. Sepsis. EXAM: PORTABLE CHEST 1 VIEW COMPARISON:  Radiograph 03/05/2019 FINDINGS: Patient is rotated to the right. Unchanged heart size and mediastinal contours allowing for rotation with borderline cardiomegaly. Atherosclerosis of the aortic arch. No acute airspace disease. No pleural effusion or pneumothorax. No pulmonary edema. No acute rib fracture or acute osseous abnormality. Overlying monitoring devices. Linear density projecting over the right supraclavicular soft tissues is presumably external to the patient. IMPRESSION: 1. No acute findings allowing for rotation. 2.  Aortic Atherosclerosis (ICD10-I70.0). Electronically Signed   By: Keith Rake M.D.   On: 07/21/2019 04:41    EKG  EKG Interpretation  Date/Time:  Thursday July 21 2019 03:25:38 EST Ventricular Rate:  52 PR Interval:    QRS Duration: 113 QT Interval:  561 QTC Calculation: 522 R Axis:   -6 Text Interpretation: Sinus  rhythm Borderline T abnormalities, lateral leads Prolonged QT interval Confirmed by Dory Horn) on 07/21/2019 5:01:53 AM       Radiology No results  found.  Procedures .Marland KitchenLaceration Repair  Date/Time: 07/21/2019 5:04 AM Performed by: Veatrice Kells, MD Authorized by: Veatrice Kells, MD   Consent:    Consent obtained:  Verbal and emergent situation   Risks discussed:  Infection, need for additional repair and nerve damage   Alternatives discussed:  No treatment Anesthesia (see MAR for exact dosages):    Anesthesia method:  Topical application and local infiltration   Local anesthetic:  Lidocaine 1% WITH epi Laceration details:    Location:  Face   Face location:  Forehead   Length (cm):  2 (gaping with centyral loss of tissue)   Depth (mm):  1 Repair type:    Repair type:  Simple Pre-procedure details:    Preparation:  Patient was prepped and draped in usual sterile fashion Exploration:    Hemostasis achieved with:  Direct pressure   Wound exploration: wound explored through full range of motion     Wound extent: no areolar tissue violation noted     Contaminated: no   Treatment:    Area cleansed with:  Betadine and Shur-Clens (chlorhexidine)   Amount of cleaning:  Extensive   Irrigation solution:  Sterile saline   Irrigation method:  Syringe Skin repair:    Repair method:  Sutures   Suture size:  4-0   Wound skin closure material used:  vicryl    Suture technique:  Simple interrupted   Number of sutures:  7 Approximation:    Approximation:  Close (undermining ) Post-procedure details:    Dressing:  Sterile dressing   Patient tolerance of procedure:  Tolerated well, no immediate complications   (including critical care time)  Medications Ordered in ED Medications  ceFEPIme (MAXIPIME) 2 g in sodium chloride 0.9 % 100 mL IVPB (2 g Intravenous New Bag/Given 07/21/19 0418)  metroNIDAZOLE (FLAGYL) IVPB 500 mg (500 mg Intravenous New Bag/Given 07/21/19 0417)  vancomycin (VANCOCIN) IVPB 1000 mg/200 mL premix (1,000 mg Intravenous New Bag/Given 07/21/19 0411)  lidocaine-EPINEPHrine-tetracaine (LET) topical gel (has no  administration in time range)  sodium chloride 0.9 % bolus 1,000 mL (has no administration in time range)    ED Course  I have reviewed the triage vital signs and the nursing notes.  Pertinent labs & imaging results that were available during my care of the patient were reviewed by me and considered in my medical decision making (see chart for details).    MDM Rules/Calculators/A&P                     MDM Reviewed: previous chart, nursing note and vitals Interpretation: labs, ECG and x-ray (No chf, elevated lactate) Total time providing critical care: 75-105 minutes (Bair hugger initiated by me.  NSG home contacted by me,  Sepsis initiated by me). This excludes time spent performing separately reportable procedures and services. Consults: critical care  CRITICAL CARE Performed by: Everardo Voris K Amoria Mclees-Rasch Total critical care time: 90 minutes Critical care time was exclusive of separately billable procedures and treating other patients. Critical care was necessary to treat or prevent imminent or life-threatening deterioration. Critical care was time spent personally by me on the following activities: development of treatment plan with patient and/or surrogate as well as nursing, discussions with consultants, evaluation of patient's response to treatment, examination of patient, obtaining history from patient or surrogate, ordering and performing treatments and interventions, ordering and review of laboratory studies, ordering and review of radiographic studies, pulse oximetry and re-evaluation of patient's condition.  Final Clinical Impression(s) / ED Diagnoses Will admit for sepsis    Kahlen Morais, MD 07/21/19 [id number]

## 2019-07-22 NOTE — Progress Notes (Signed)
Nutrition Brief Note  Chart reviewed. Pt now transitioning to comfort care.  No further nutrition interventions warranted at this time.  Please re-consult as needed.   Teresa Lemmerman A. Dorise Gangi, RD, LDN, CDCES Registered Dietitian II Certified Diabetes Care and Education Specialist Pager: [phone] After hours Pager: [phone]  

## 2019-07-22 NOTE — Progress Notes (Addendum)
   Subjective: Pt seen at the bedside this morning. Unresponsive, though appears to be comfortable at this time.   Objective:  Vital signs in last 24 hours: Vitals:   07/21/19 1255 07/21/19 1448 07/21/19 1550 07/22/19 0500  BP: (!) 104/54 (!) 110/59 (!) 92/51 111/74  Pulse: (!) 110 (!) 108 95 87  Resp: (!) 6 12 14 12   Temp: 99.1 F (37.3 C) 99.1 F (37.3 C) 99.1 F (37.3 C) (!) 97.4 F (36.3 C)  TempSrc:   Oral Axillary  SpO2: 93% 92% 94% (!) 82%  Weight:      Height:       Physical Exam Constitutional:      General: She is sleeping.     Appearance: She is ill-appearing (chronically).  HENT:     Head:     Comments: Bruising + laceration over R eye Pulmonary:     Effort: Bradypnea present. No respiratory distress.     Comments: Transmitted upper airway sounds. Neurological:     Mental Status: She is unresponsive.    Assessment/Plan:  Active Problems:   Sepsis [facility])  Ms. Altier is a 78 year old F with significant PMH of advanced dementia, hypertension, hyperlipidemia, and hypothyroidism who presented after a fall. She was found to be hypothermic and hypotensive concerning for sepsis. In discussion with family and in accordance with the pt's wishes, she was transitioned to comfort care.  Please allow Yanin Muhlestein, husband, to be at the bedside  Goals of Care Palliative care on board. - No vasopressors, CPR, intubation, antibiotics, or IVF  - limit lab draws  - stop all nonessential medications  - morphine, lorazepam, tylenol, ondansetron, and glycopyrrolate all available   Diet: meals for comfort Fluids: end of life VTE ppx: end of life CODE STATUS: DNR  Dispo: At end of life; ?transfer to residential hospice pending pt's course  Ladona Horns, MD 07/22/2019, 6:19 AM Pager: [phone]

## 2019-07-22 NOTE — Plan of Care (Signed)
  Problem: Pain Managment: Goal: General experience of comfort will improve Outcome: Progressing   

## 2019-07-23 NOTE — Progress Notes (Signed)
The patient husband Ron prefer not residential hospice he rather want to transfer to hospice.

## 2019-07-23 NOTE — TOC Progression Note (Addendum)
Transition of Care [facility]) - Progression Note    Patient Details  Name: Claire Hoffman MRN: [id number] Date of Birth: 1940/10/16  [facility] CM/SW Contact  Claudie Leach, RN 07/23/2019, 1:10 PM  Clinical Narrative:    Patient's husband, Claire Hoffman, expresses that he would like for patient to be transferred to residential hospice.  Spoke with Claire Hoffman regarding choice between [facility] and [place] ([facility]).  Claire Hoffman would like to pursue [facility] and would not be interested in the [facility] facility.    Referral called to Lattie Haw with Manufacturing engineer [facility]).  There are beds at [facility] today.  She will contact husband to make arrangements.      Barriers to Discharge: No Barriers Identified

## 2019-07-23 NOTE — Discharge Summary (Signed)
Name: Claire Hoffman MRN: [id number] DOB: 1940/10/23 78 y.o. PCP: Ron Parker, MD  Date of Admission: 07/21/2019  3:13 AM Date of Discharge: 07/24/2019 Attending Physician: Anne Shutter, MD  Discharge Diagnosis: 1. Sepsis  Discharge Medications: Allergies as of 07/24/2019   No Known Allergies     Medication List    STOP taking these medications   amLODipine 10 MG tablet Commonly known as: NORVASC   ascorbic acid 500 MG tablet Commonly known as: VITAMIN C   aspirin 81 MG chewable tablet   atorvastatin 10 MG tablet Commonly known as: LIPITOR   divalproex 125 MG DR tablet Commonly known as: DEPAKOTE   ferrous sulfate 325 (65 FE) MG tablet   haloperidol 5 MG tablet Commonly known as: HALDOL   levothyroxine 75 MCG tablet Commonly known as: SYNTHROID   multivitamin with minerals Tabs tablet   NUTRITIONAL SHAKE PO   traZODone 50 MG tablet Commonly known as: DESYREL     TAKE these medications   acetaminophen 325 MG tablet Commonly known as: TYLENOL Take 2 tablets (650 mg total) by mouth every 6 (six) hours as needed for mild pain (or Fever >/= 101).   acetaminophen 650 MG suppository Commonly known as: TYLENOL Place 1 suppository (650 mg total) rectally every 6 (six) hours as needed for mild pain (or Fever >/= 101).   glycopyrrolate 0.2 MG/ML injection Commonly known as: ROBINUL Inject 2 mLs (0.4 mg total) into the vein every 6 (six) hours.   glycopyrrolate 0.2 MG/ML injection Commonly known as: ROBINUL Inject 2 mLs (0.4 mg total) into the vein every 4 (four) hours as needed (secretion refractory to scheduled doses).   LORazepam 2 MG/ML injection Commonly known as: ATIVAN Inject 0.5 mLs (1 mg total) into the vein every 4 (four) hours as needed for anxiety (refractory to scheduled doses).   LORazepam 2 MG/ML injection Commonly known as: ATIVAN Inject 0.5 mLs (1 mg total) into the vein every 6 (six) hours.   morphine 2 MG/ML injection  Inject 1 mL (2 mg total) into the vein every 6 (six) hours.   morphine 2 MG/ML injection Inject 1 mL (2 mg total) into the vein every 15 (fifteen) minutes as needed (pain refractory to scheduled doses).   ondansetron 4 MG/2ML Soln injection Commonly known as: ZOFRAN Inject 2 mLs (4 mg total) into the vein every 6 (six) hours as needed for nausea.       Disposition and follow-up:   Ms.Claire Hoffman was discharged from [facility] in Fair condition.  At the hospital follow up visit please address:  1.  Goals of Care Pt's husband, Ferne Reus, is main point of contact - phone number's [phone] and [phone] - no IV fluids, antibiotics, pressors, or CPR/intubation - stop all non-essential medications - continue medications focused on keeping the pt comfortable   2.  Labs / imaging needed at time of follow-up: NONE  3.  Pending labs/ test needing follow-up: NONE  Follow-up Appointments:   Hospital Course by problem list: 1. Ms.Claire Hoffman is a 78 yearold F with significant PMH ofadvanced dementia, hypertension, hyperlipidemia, and hypothyroidism who presented after afall thought be from out of bed at [facility]. She was found to be hypothermic and hypotensive concerning for sepsis. She was given approximately 4L fluids in the ED and remained hypotensive. Pt's husband was contacted who confirmed that she was DNR and would not want pressors, CPR, or intubation. After a long discussion with the husband and admitting team regarding pt's  condition, prognosis, and prior baseline function, pt's husband wanted to focus on pt's comfort and not pursue aggressive therapies that would not be within the pt's wishes. In accordance with this, pt was transitioned to comfort care and palliative care was consulted. On hospital day 2, pt's temperature and blood pressue had stabilized and she appeared stable on exam. At that time, internal medicine and palliative care recommended  residential hospice which pt's husband was interesting in and agreeable to. Referral placed to [facility] for transfer on 07/24/2019.  Discharge Vitals:   BP (!) 100/59   Pulse 97   Temp 97.7 F (36.5 C) (Oral)   Resp 20   Ht  (1.626 m)   Wt 50.4 kg   SpO2 (!) 79%   BMI 19.07 kg/m   Pertinent Labs, Studies, and Procedures:  Vitals with BMI 07/24/2019 07/23/2019 07/22/2019  Height - - -  Weight - - -  BMI - - -  Systolic 100 124 098  Diastolic 59 72 74  Pulse 97 96 87   Temp Readings from Last 3 Encounters:  07/23/19 97.7 F (36.5 C) (Oral)  04/18/19 97.8 F (36.6 C) (Axillary)  03/14/19 98 F (36.7 C) (Axillary)   CBC Latest Ref Rng & Units 07/21/2019 03/11/2019 03/10/2019  WBC 4.0 - 10.5 K/uL 5.6 7.9 9.1  Hemoglobin 12.0 - 15.0 g/dL 11.9 1.4(N) 8.1(L)  Hematocrit 36.0 - 46.0 % 40.4 26.6(L) 26.8(L)  Platelets 150 - 400 K/uL 162 242 262   BMP Latest Ref Rng & Units 07/21/2019 03/08/2019 03/05/2019  Glucose 70 - 99 mg/dL 829(F) 87 621(H)  BUN 8 - 23 mg/dL Creatinine 0.44 - 1.00 mg/dL 0.86 5.78 4.69  Sodium 135 - 145 mmol/L 144 140 139  Potassium 3.5 - 5.1 mmol/L 3.6 3.8 4.2  Chloride 98 - 111 mmol/L 99 107 105  CO2 22 - 32 mmol/L Calcium 8.9 - 10.3 mg/dL 62.9 8.3(L) 8.6(L)   Recent Results (from the past 240 hour(s))  Blood Culture (routine x 2)     Status: None (Preliminary result)   Collection Time: 07/21/19  3:24 AM   Specimen: BLOOD  Result Value Ref Range Status   Specimen Description BLOOD LEFT FOREARM  Final   Special Requests   Final    BOTTLES DRAWN AEROBIC AND ANAEROBIC Blood Culture adequate volume   Culture   Final    NO GROWTH 2 DAYS Performed at [facility], [address] [place], [place] [address]    Report Status PENDING  Incomplete  Blood Culture (routine x 2)     Status: None (Preliminary result)   Collection Time: 07/21/19  3:49 AM   Specimen: BLOOD  Result Value Ref Range Status   Specimen Description BLOOD  LEFT ARM  Final   Special Requests   Final    BOTTLES DRAWN AEROBIC AND ANAEROBIC Blood Culture results may not be optimal due to an inadequate volume of blood received in culture bottles   Culture   Final    NO GROWTH 2 DAYS Performed at [facility], [address] [place], [place] [address]    Report Status PENDING  Incomplete  Urine culture     Status: Abnormal   Collection Time: 07/21/19  4:04 AM   Specimen: In/Out Cath Urine  Result Value Ref Range Status   Specimen Description IN/OUT CATH URINE  Final   Special Requests   Final    NONE Performed  at [facility], [place] [address] [place], [place] [address]    Culture MULTIPLE SPECIES PRESENT, SUGGEST RECOLLECTION (A)  Final   Report Status 07/21/2019 FINAL  Final  SARS CORONAVIRUS 2 (TAT 6-24 HRS) Nasopharyngeal Nasopharyngeal Swab     Status: None   Collection Time: 07/21/19  4:25 AM   Specimen: Nasopharyngeal Swab  Result Value Ref Range Status   SARS Coronavirus 2 NEGATIVE NEGATIVE Final    Comment: (NOTE) SARS-CoV-2 target nucleic acids are NOT DETECTED. The SARS-CoV-2 RNA is generally detectable in upper and lower respiratory specimens during the acute phase of infection. Negative results do not preclude SARS-CoV-2 infection, do not rule out co-infections with other pathogens, and should not be used as the sole basis for treatment or other patient management decisions. Negative results must be combined with clinical observations, patient history, and epidemiological information. The expected result is Negative. Fact Sheet for Patients: SugarRoll.be Fact Sheet for Healthcare Providers: https://www.woods-mathews.com/ This test is not yet approved or cleared by the [place] FDA and  has been authorized for detection and/or diagnosis of SARS-CoV-2 by FDA under an Emergency Use Authorization (EUA). This EUA will remain  in effect (meaning this test can be used)  for the duration of the COVID-19 declaration under Section 56 4(b)(1) of the Act, 21 U.S.C. section 360bbb-3(b)(1), unless the authorization is terminated or revoked sooner. Performed at [facility], [place] [address] [place], [place] [address]    CXR 07/21/2019 CLINICAL DATA:  Fall. Sepsis.  EXAM: PORTABLE CHEST 1 VIEW  COMPARISON:  Radiograph 03/05/2019  FINDINGS: Patient is rotated to the right. Unchanged heart size and mediastinal contours allowing for rotation with borderline cardiomegaly. Atherosclerosis of the aortic arch. No acute airspace disease. No pleural effusion or pneumothorax. No pulmonary edema. No acute rib fracture or acute osseous abnormality. Overlying monitoring devices. Linear density projecting over the right supraclavicular soft tissues is presumably external to the patient.  IMPRESSION: 1. No acute findings allowing for rotation. 2.  Aortic Atherosclerosis (ICD10-I70.0).   CT HEAD 07/21/2019 CLINICAL DATA:  Fall from bed with head injury  EXAM: CT HEAD WITHOUT CONTRAST  CT CERVICAL SPINE WITHOUT CONTRAST  TECHNIQUE: Multidetector CT imaging of the head and cervical spine was performed following the standard protocol without intravenous contrast. Multiplanar CT image reconstructions of the cervical spine were also generated.  COMPARISON:  04/18/2019  FINDINGS: CT HEAD FINDINGS  Brain: No evidence of acute infarction, hemorrhage, hydrocephalus, extra-axial collection or mass lesion/mass effect. Atrophy. Moderate chronic small vessel ischemic change in the white matter.  Vascular: No hyperdense vessel or unexpected calcification.  Skull: Large hematoma about the right eye and forehead. Negative for fracture.  Sinuses/Orbits: Bilateral cataract resection. No evidence of postseptal injury  CT CERVICAL SPINE FINDINGS  Alignment: No traumatic malalignment. Mild degenerative anterolisthesis at C3-4 and C7-T1   Skull base and vertebrae: No acute fracture  Soft tissues and spinal canal: No prevertebral fluid or swelling. No visible canal hematoma.  Disc levels: Mid to lower cervical advanced degenerative disc narrowing. Multilevel facet spurring with facet ankylosis on the right at C5-6.  Upper chest: Minimal coverage is negative  IMPRESSION: 1. No evidence of acute intracranial injury. 2. Large right-sided scalp and face hematoma without fracture. 3. Cervical spine degeneration without fracture.   CT C SPINE 07/21/2019 CLINICAL DATA:  Fall from bed with head injury  EXAM: CT HEAD WITHOUT CONTRAST  CT CERVICAL SPINE WITHOUT CONTRAST  TECHNIQUE: Multidetector CT imaging of the head  and cervical spine was performed following the standard protocol without intravenous contrast. Multiplanar CT image reconstructions of the cervical spine were also generated.  COMPARISON:  04/18/2019  FINDINGS: CT HEAD FINDINGS  Brain: No evidence of acute infarction, hemorrhage, hydrocephalus, extra-axial collection or mass lesion/mass effect. Atrophy. Moderate chronic small vessel ischemic change in the white matter.  Vascular: No hyperdense vessel or unexpected calcification.  Skull: Large hematoma about the right eye and forehead. Negative for fracture.  Sinuses/Orbits: Bilateral cataract resection. No evidence of postseptal injury  CT CERVICAL SPINE FINDINGS  Alignment: No traumatic malalignment. Mild degenerative anterolisthesis at C3-4 and C7-T1  Skull base and vertebrae: No acute fracture  Soft tissues and spinal canal: No prevertebral fluid or swelling. No visible canal hematoma.  Disc levels: Mid to lower cervical advanced degenerative disc narrowing. Multilevel facet spurring with facet ankylosis on the right at C5-6.  Upper chest: Minimal coverage is negative  IMPRESSION: 1. No evidence of acute intracranial injury. 2. Large right-sided scalp and  face hematoma without fracture. 3. Cervical spine degeneration without fracture.    Discharge Instructions: Discharge Instructions    Discharge instructions   Complete by: As directed    Please continue to provide care aimed at keeping the pt comfortable at the end of her life. Do not give IV fluids, antibiotics, non-essential medications, or perform CPR/intubation. Pt's husband, Ferne ReusRon, is the main point of contact for updates on her care.      Signed: Thom ChimesJones, Rashell Shambaugh, MD 07/24/2019, 9:34 AM   Pager: [phone]

## 2019-07-23 NOTE — Care Management (Signed)
Patient will go to [facility] 07/24/2019 per husband's request.  Patient will likely be ready to transfer around noon.

## 2019-07-23 NOTE — Progress Notes (Signed)
   Subjective: Pt seen at the bedside this morning. Appears comfortable laying in bed. Does not respond to voice or entrance into the room.  Objective:  Vital signs in last 24 hours: Vitals:   07/21/19 1448 07/21/19 1550 07/22/19 0500 07/23/19 0430  BP: (!) 110/59 (!) 92/51 111/74 124/72  Pulse: (!) 108 95 87 96  Resp: 12 14 12 20   Temp: 99.1 F (37.3 C) 99.1 F (37.3 C) (!) 97.4 F (36.3 C) 97.7 F (36.5 C)  TempSrc:  Oral Axillary Oral  SpO2: 92% 94% (!) 82% (!) 79%  Weight:      Height:       Physical Exam Vitals and nursing note reviewed.  Constitutional:      Appearance: She is ill-appearing (chronically).  Cardiovascular:     Rate and Rhythm: Normal rate and regular rhythm.  Pulmonary:     Effort: Bradypnea present.     Comments: Audible snoring and wheezing upon entering the room. Neurological:     Mental Status: She is unresponsive.    Assessment/Plan:  Principal Problem:   Sepsis ([place]) Active Problems:   HTN (hypertension)   HLD (hyperlipidemia)   Dementia ([place])   Hypothermia  Claire Hoffman is a 78 year old F with significant PMH ofadvanced dementia, hypertension, hyperlipidemia, and hypothyroidism who presented after a fall. She was found to be hypothermic and hypotensive concerning for sepsis. In discussion with family and in accordance with the pt's wishes, she was transitioned to comfort care.  Please continue to allow Claire Hoffman, husband, to be at the bedside  Goals of Care Pt appears to have stabilized and is more comfortable compared to her initial presentation to the emergency department. In discussion with the pt's husband yesterday, referral placed for transfer to residential hospice as that is appropriate for her course at this time. - palliative care on board - novasopressors, CPR, intubation, antibiotics, or IVF  - limit lab draws and stop all nonessential medications  - morphine, lorazepam, tylenol, ondansetron, and glycopyrrolate all  available    Diet:meals for comfort Fluids: end of life VTE ppx:end of life CODE STATUS:DNR   Dispo: At the end of life - hopefull to transfer to residential hospice per husband's wishes   Claire Horns, MD 07/23/2019, 6:23 AM Pager: [phone]

## 2019-07-23 NOTE — Progress Notes (Signed)
Manufacturing engineer [facility]) Hospital Liaison RN note.  Received request from [facility] Caryl Pina) for family interest in [facility] with request for transfer today. Chart reviewed. Spoke with spouse Edd Arbour) to confirm interest and explain services. Family agreeable to transfer tomorrow. CSW aware. Dr. Orpah Melter to assume care per husband's [facility]) request. Registration paper work to be completed at 10:30 tomorrow.   Please fax discharge summary to [phone]. RN please call report to [phone].  Please arrange transport for patient to arrive before noon if possible.  Thank you.   Vicente Serene BSN [facility] Liaison  [phone]  [place] are on AMION

## 2019-07-24 MED ORDER — ACETAMINOPHEN 650 MG RE SUPP: 650.0000 mg | Freq: Four times a day (QID) | RECTAL | 0 refills | Status: AC | PRN

## 2019-07-24 MED ORDER — GLYCOPYRROLATE 0.2 MG/ML IJ SOLN: 0.4000 mg | Freq: Four times a day (QID) | INTRAMUSCULAR | Status: AC

## 2019-07-24 MED ORDER — LORAZEPAM 2 MG/ML IJ SOLN: 1.0000 mg | Freq: Four times a day (QID) | INTRAMUSCULAR | 0 refills | Status: AC

## 2019-07-24 MED ORDER — LORAZEPAM 2 MG/ML IJ SOLN: 1.0000 mg | INTRAMUSCULAR | 0 refills | Status: AC | PRN

## 2019-07-24 MED ORDER — ACETAMINOPHEN 325 MG PO TABS: 650.0000 mg | ORAL_TABLET | Freq: Four times a day (QID) | ORAL | Status: AC | PRN

## 2019-07-24 MED ORDER — MORPHINE SULFATE (PF) 2 MG/ML IV SOLN: 2.0000 mg | INTRAVENOUS | 0 refills | Status: AC | PRN

## 2019-07-24 MED ORDER — MORPHINE SULFATE (PF) 2 MG/ML IV SOLN: 2.0000 mg | Freq: Four times a day (QID) | INTRAVENOUS | 0 refills | Status: AC

## 2019-07-24 MED ORDER — GLYCOPYRROLATE 0.2 MG/ML IJ SOLN: 0.4000 mg | INTRAMUSCULAR | Status: AC | PRN

## 2019-07-24 MED ORDER — ONDANSETRON HCL 4 MG/2ML IJ SOLN: 4.0000 mg | Freq: Four times a day (QID) | INTRAMUSCULAR | 0 refills | Status: AC | PRN

## 2019-07-24 NOTE — Progress Notes (Signed)
Report called to Teresa at [facility]. 

## 2019-07-24 NOTE — Progress Notes (Signed)
   Subjective: Pt seen at the bedside this morning. Plan for transfer to [facility] later this morning. Pt stable this morning, though appears more uncomfortable on examination with labored breathing and audible gargling.  Objective:  Vital signs in last 24 hours: Vitals:   07/21/19 1550 07/22/19 0500 07/23/19 0430 07/24/19 0548  BP: (!) 92/51 111/74 124/72 (!) 100/59  Pulse: 95 87 96 97  Resp: 14 12 20    Temp: 99.1 F (37.3 C) (!) 97.4 F (36.3 C) 97.7 F (36.5 C)   TempSrc: Oral Axillary Oral   SpO2: 94% (!) 82% (!) 79%   Weight:      Height:       Physical Exam Constitutional:      Appearance: She is ill-appearing (chronically).  Pulmonary:     Effort: Tachypnea present.  Skin:    Comments: Healing bruising over R eye and down R chin/neck.  Neurological:     Mental Status: She is unresponsive.    Assessment/Plan:  Principal Problem:   Sepsis ([place]) Active Problems:   HTN (hypertension)   HLD (hyperlipidemia)   Dementia ([place])   Hypothermia  Ms.Gaul is a 78 yearold F with significant PMH ofadvanced dementia, hypertension, hyperlipidemia, and hypothyroidism who presented after afall. She was found to be hypothermic and hypotensive concerning for sepsis. In discussion with family and in accordance with the pt's wishes, she was transitioned to comfort care.  Goals of Care - referral placed to [facility] yesterday, with anticipated transfer at noon today - pt's husband, Ron, is main point of contact for updates - palliative care on board - novasopressors, CPR, intubation, antibiotics, or IVF  -limit lab draws and stop all nonessential medications - morphine, lorazepam, tylenol, ondansetron, and glycopyrrolate all available  Diet:meals for comfort Fluids:end of life VTE ppx:end of life CODE STATUS:DNR   Dispo: Anticipated discharge later this morning to [facility].  Ladona Horns, MD 07/24/2019, 7:02 AM Pager: [phone]

## 2019-07-24 NOTE — Care Management (Signed)
PTAR arranged for transport to [facility].  DC summary faxed to [phone] as requested.

## 2019-07-26 LAB — CULTURE, BLOOD (ROUTINE X 2)
Culture: NO GROWTH
Culture: NO GROWTH
Special Requests: ADEQUATE

## 2019-08-05 DEATH — deceased

## 2021-07-31 IMAGING — CT CT HEAD WITHOUT CONTRAST
5 of 8 series · 17 of 47 positions shown, 18 images · non-contrast
Comparison: 02/10/2019

CLINICAL DATA: Unwitnessed fall with hematoma on back of head.

EXAM:
CT HEAD WITHOUT CONTRAST
CT CERVICAL SPINE WITHOUT CONTRAST
TECHNIQUE: Multidetector CT imaging of the head and cervical spine was
performed following the standard protocol without intravenous
contrast. Multiplanar CT image reconstructions of the cervical spine
were also generated.

[Series 5: head wo · axial · 0.47mm/px · z∈[-187,-37]mm · 3 of 31 slices shown, 4 images]
[im 1/31  brain]
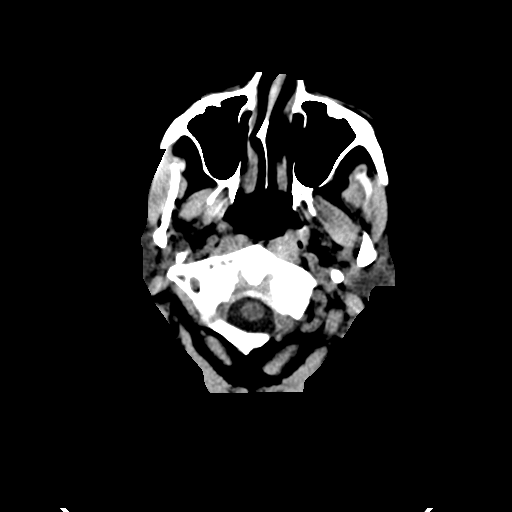
[im 1/31  bone]
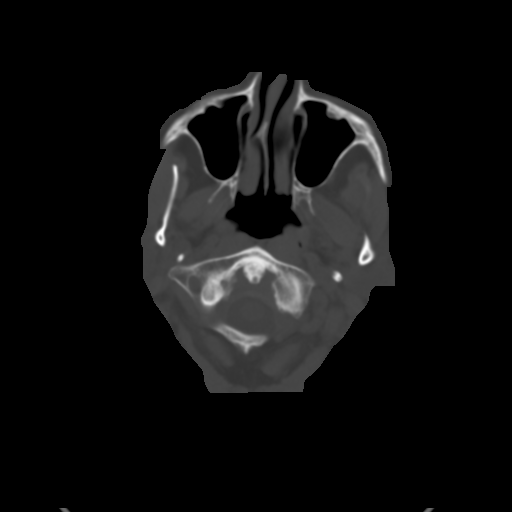
[im 16/31  brain]
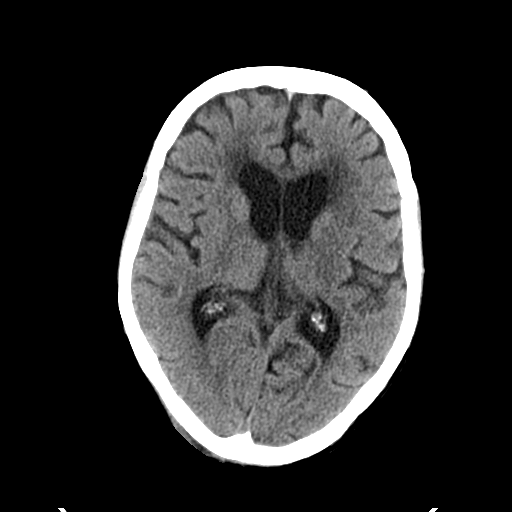
[im 31/31  brain]
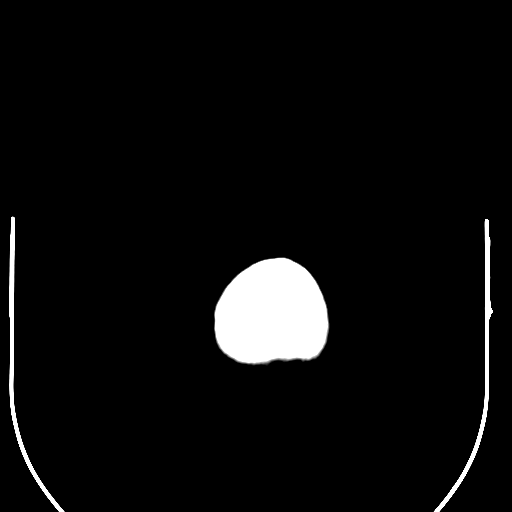

[Series 7: coronal soft tissue · coronal · 0.27mm/px · 3 of 69 slices shown]
[im 26/69  brain]
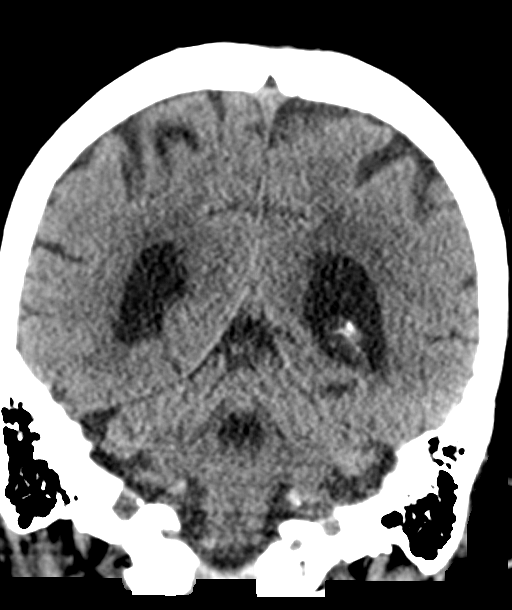
[im 35/69  brain]
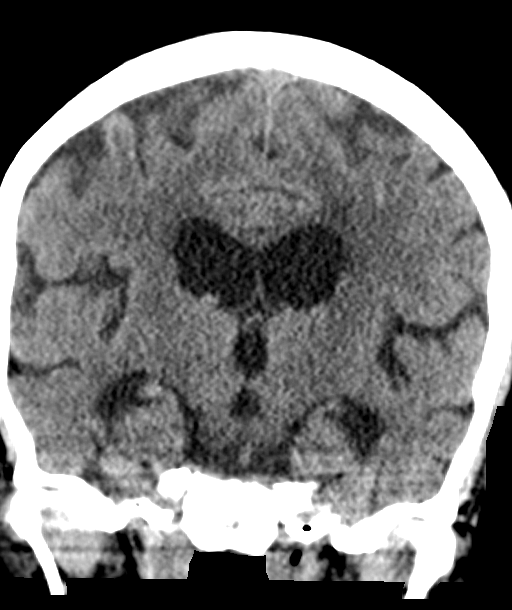
[im 43/69  brain]
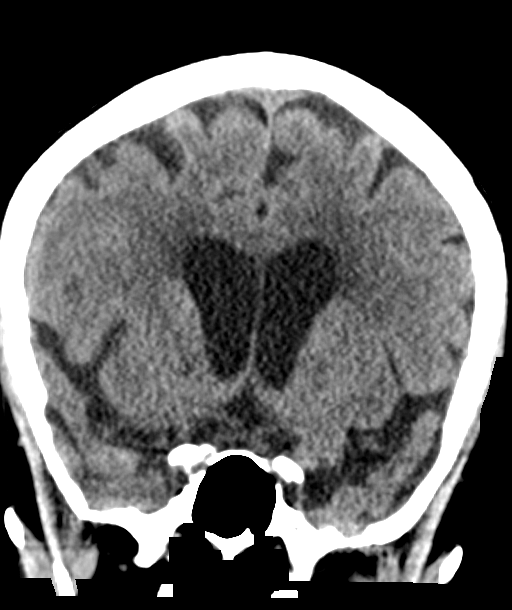

[Series 8: sagittal soft tissue · sagittal · 0.32mm/px · 1 of 46 slices shown]
[im 23/46  brain]
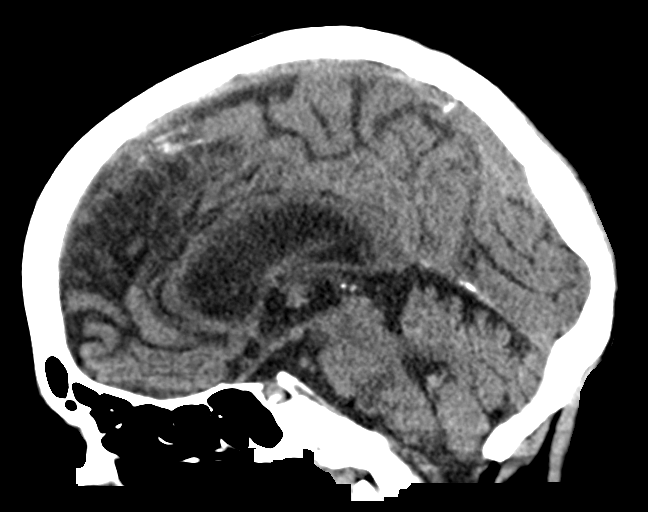

[Series 10: c spine soft · axial · 0.30mm/px · z∈[-322,-278]mm · 3 of 89 slices shown]
[im 12/89  brain]
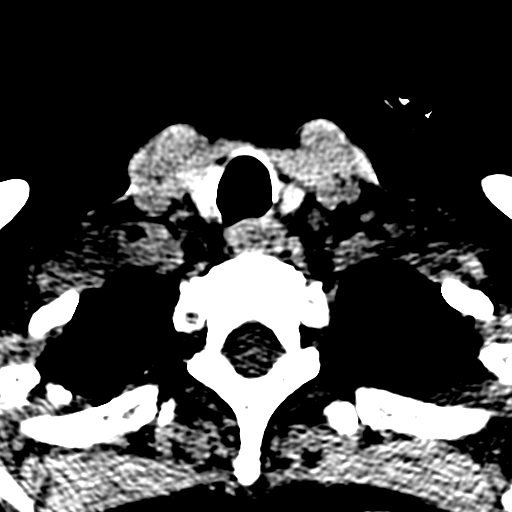
[im 23/89  brain]
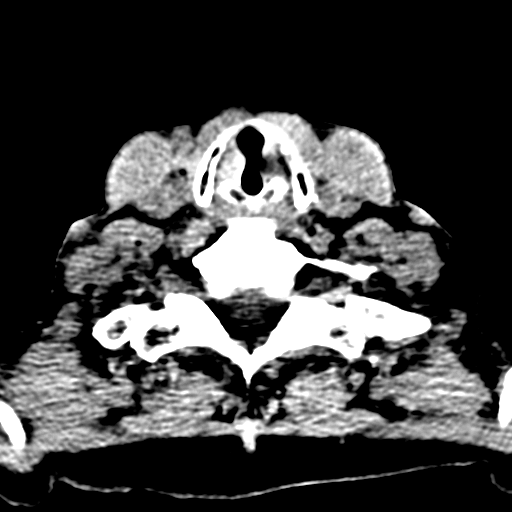
[im 34/89  brain]
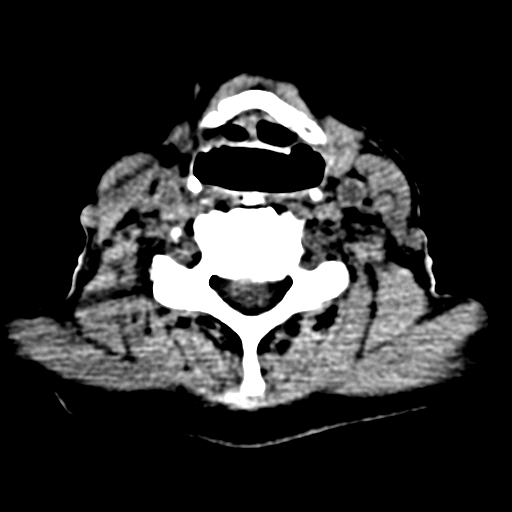

[Series 11: orthogonal bone · axial · 0.23mm/px · z∈[-343,-218]mm · 7 of 93 slices shown]
[im 12/93  bone]
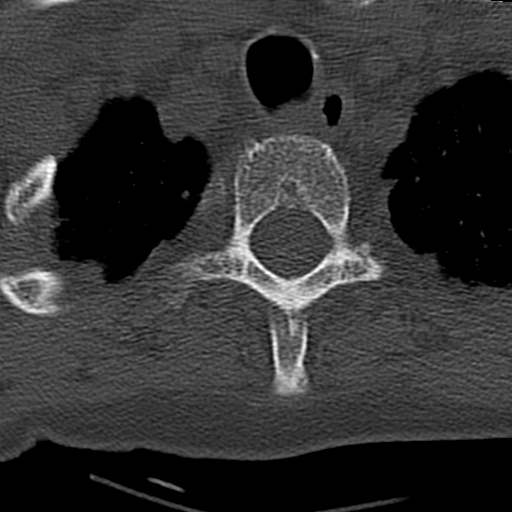
[im 24/93  bone]
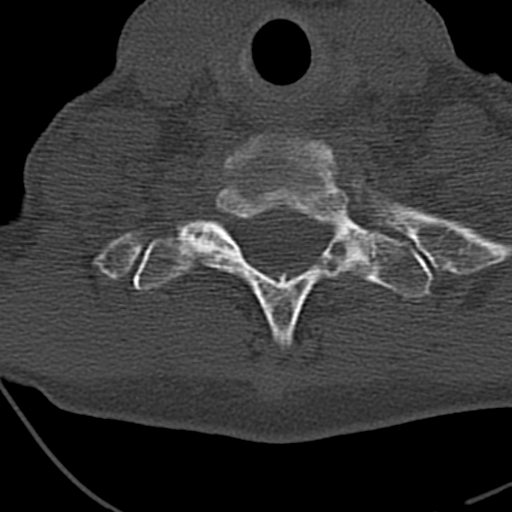
[im 35/93  bone]
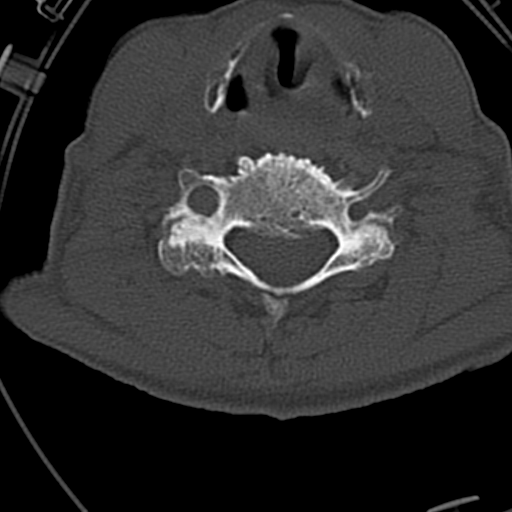
[im 47/93  bone]
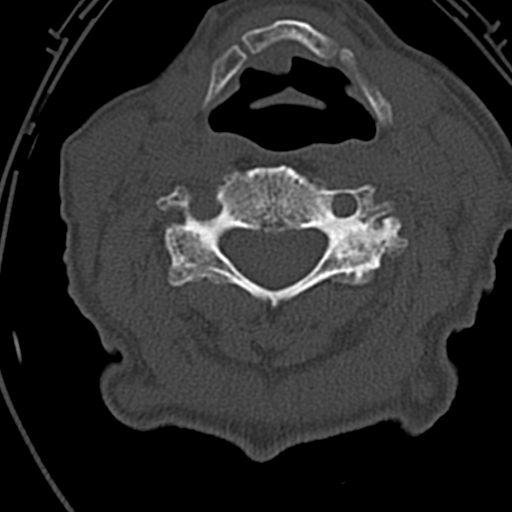
[im 58/93  bone]
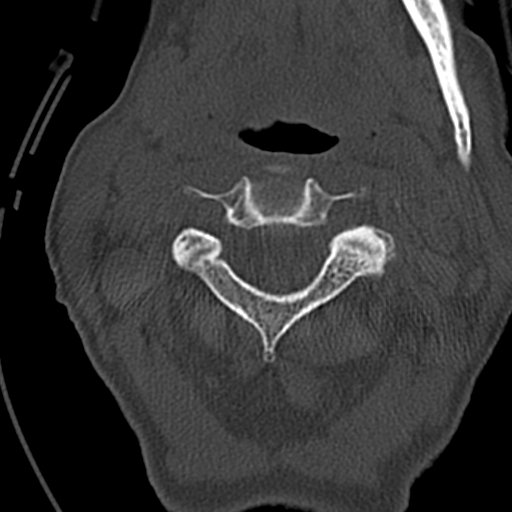
[im 70/93  bone]
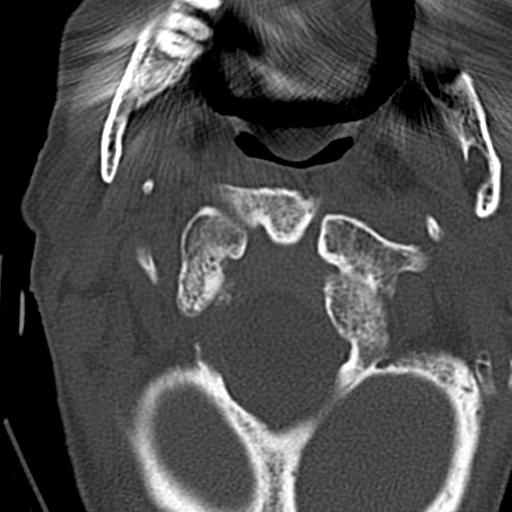
[im 81/93  bone]
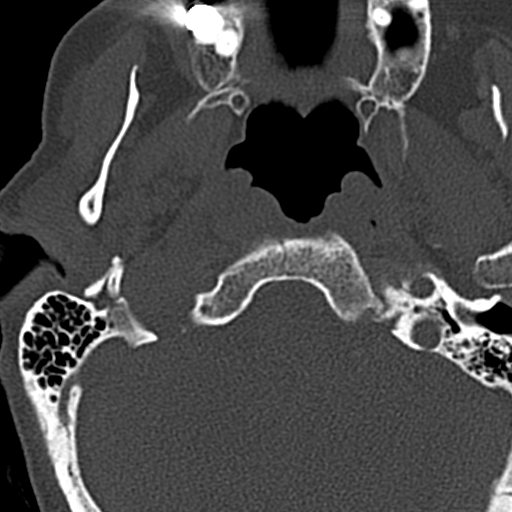

[17 of 47 positions shown; findings below may reference images not displayed]

FINDINGS: CT HEAD FINDINGS

Brain: No evidence of acute infarction, hemorrhage, hydrocephalus,
extra-axial collection or mass lesion/mass effect. There is mild
diffuse low-attenuation within the subcortical and periventricular
white matter compatible with chronic microvascular disease.
Prominence of sulci and ventricles compatible with brain atrophy.

Vascular: No hyperdense vessel or unexpected calcification.

Skull: Normal. Negative for fracture or focal lesion.

Sinuses/Orbits: No acute finding.

Other: None

CT CERVICAL SPINE FINDINGS

Alignment: Normal.

Skull base and vertebrae: No acute fracture. No primary bone lesion
or focal pathologic process.

Soft tissues and spinal canal: No prevertebral fluid or swelling. No
visible canal hematoma.

Disc levels: There is marked disc space narrowing and endplate
spurring identified at C4-5, C5-6 and C6-7. Bilateral facet
hypertrophy and degenerative change noted.

Upper chest: Negative.

Other: None
IMPRESSION: 1. No acute intracranial abnormality. Chronic small vessel ischemic
disease and brain atrophy noted.
2. No acute cervical spine findings. Multi level cervical
degenerative disc disease.

## 2021-07-31 IMAGING — CR DG HIP (WITH OR WITHOUT PELVIS) 2-3V LEFT
3 series · 3 of 3 positions shown · non-contrast
Comparison: None

CLINICAL DATA: Un witnessed fall.

EXAM:
DG HIP (WITH OR WITHOUT PELVIS) 2-3V LEFT

[x pelvis]
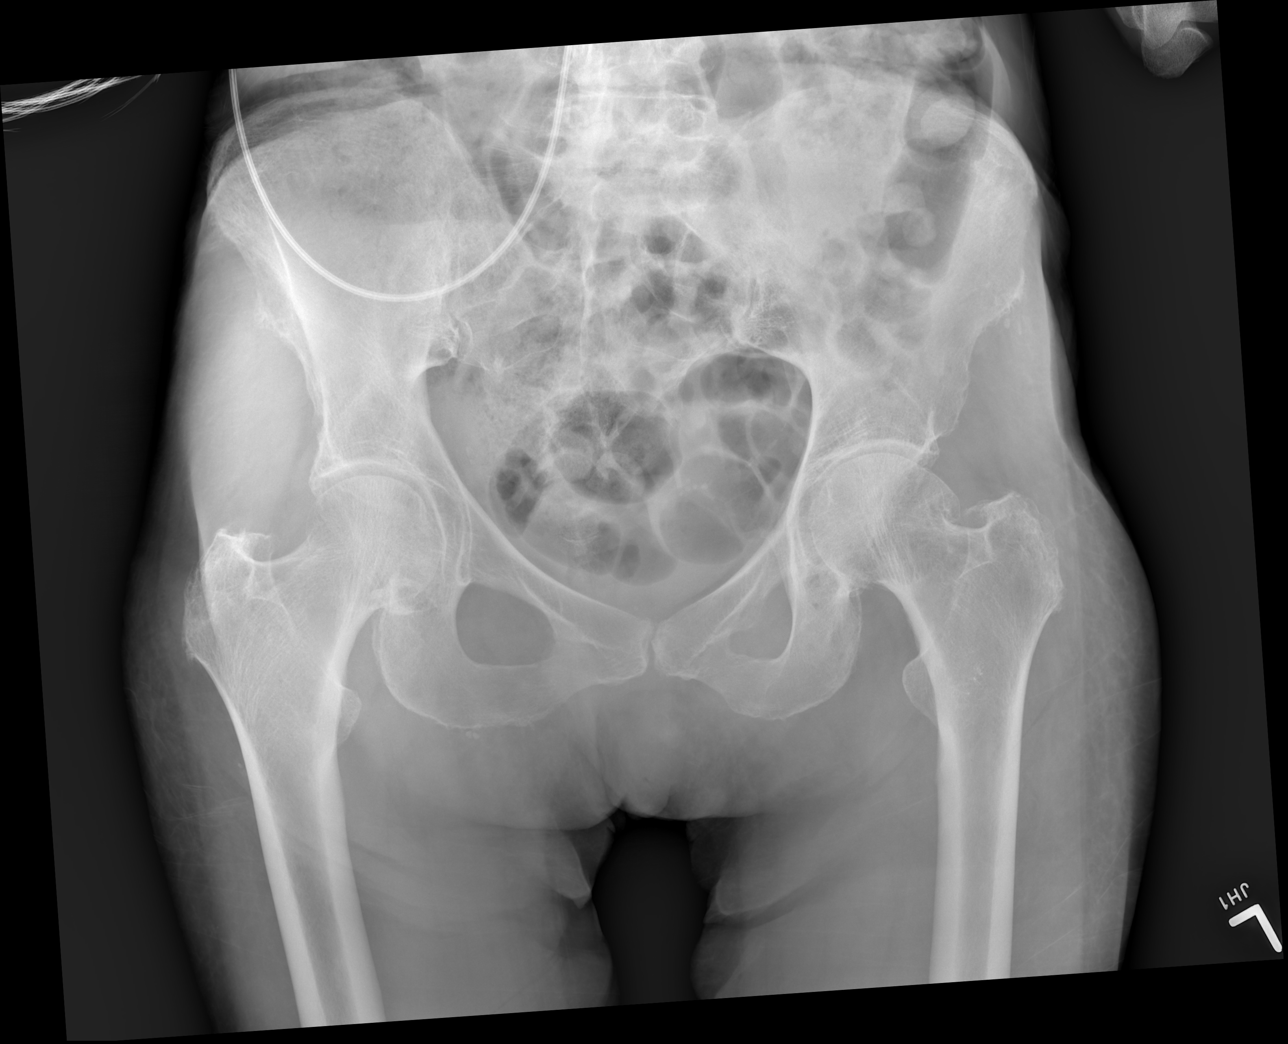

[x hip ap left]
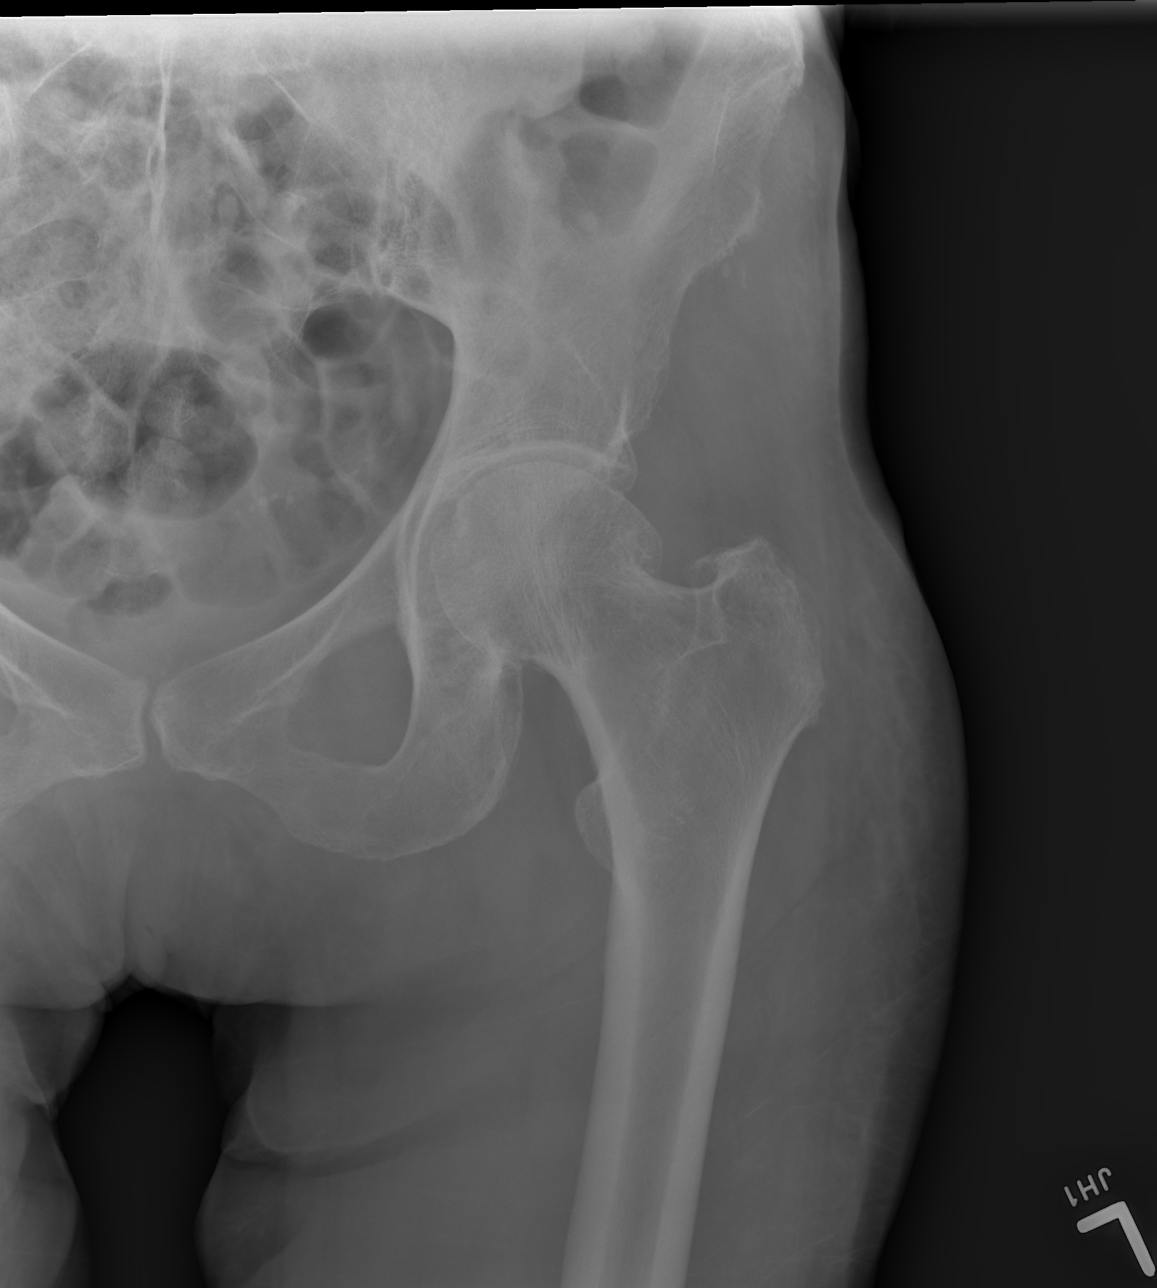

[x hip lat left]
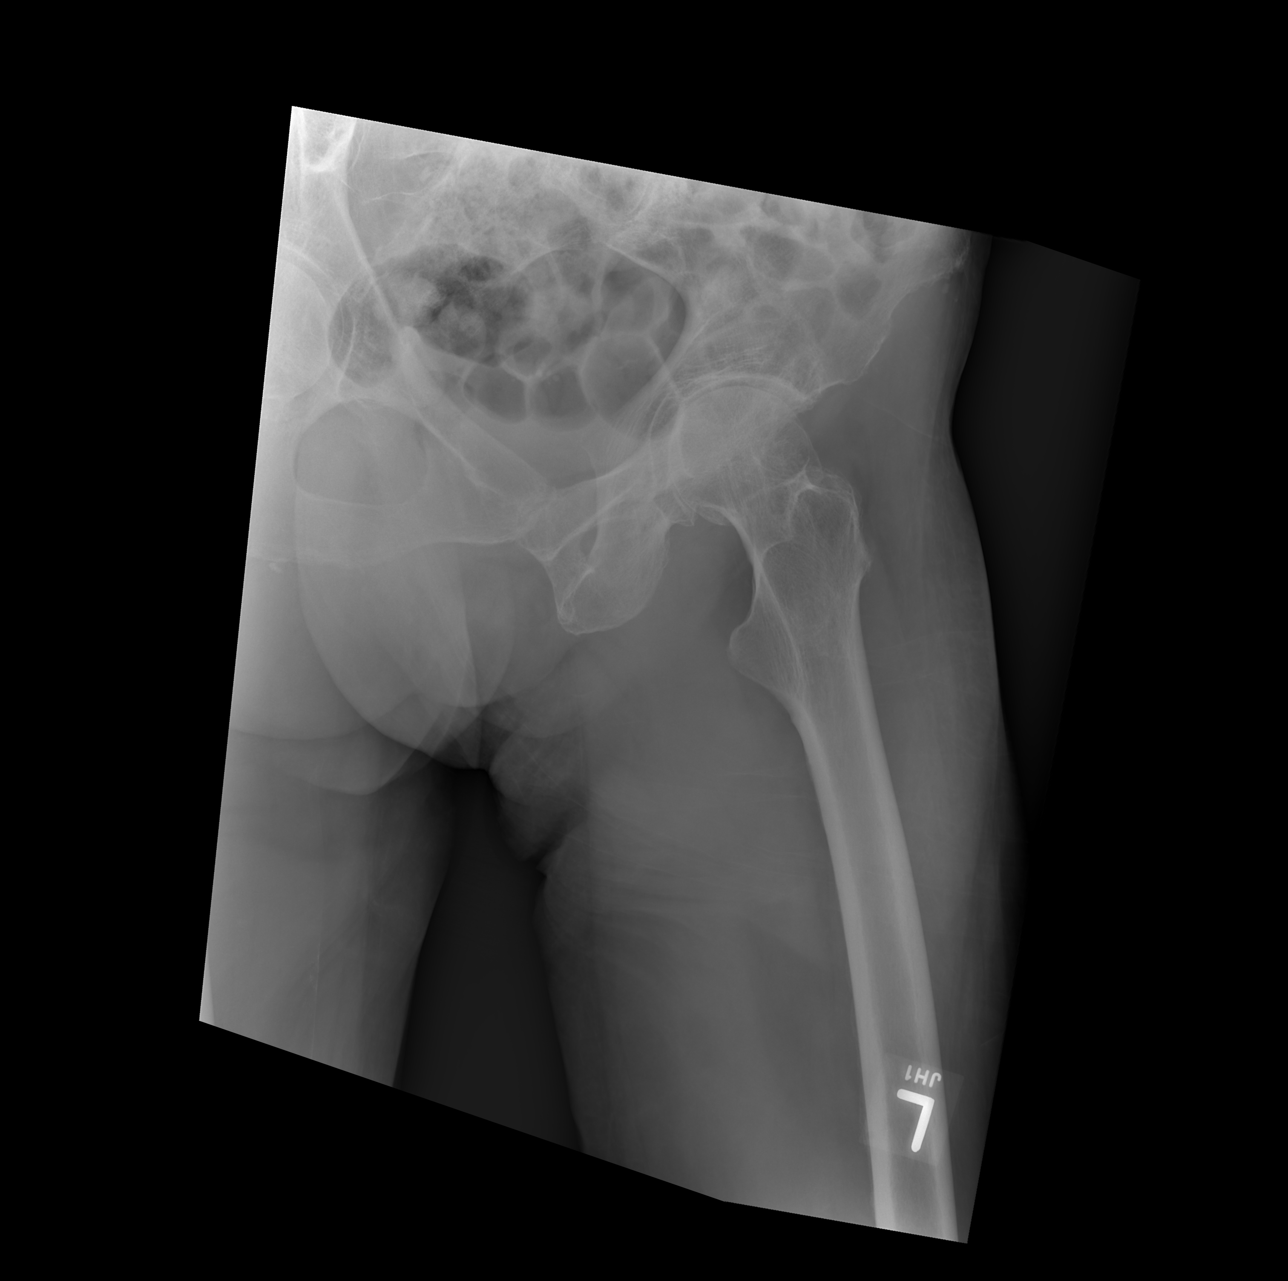

[3 of 3 positions shown; findings below may reference images not displayed]

FINDINGS: There is no evidence of acute fracture or dislocation. Moderate left
hip osteoarthritis with joint space narrowing, subchondral sclerosis
and marginal spur formation. Mild right hip osteoarthritis.
IMPRESSION: 1. No acute fracture identified.
2. Moderate left hip osteoarthritis.

## 2021-07-31 IMAGING — CR LEFT ANKLE COMPLETE - 3+ VIEW
3 series · 3 of 3 positions shown · non-contrast
Comparison: None.

CLINICAL DATA: Fall

EXAM:
LEFT ANKLE COMPLETE - 3+ VIEW

[x ankle ap left]
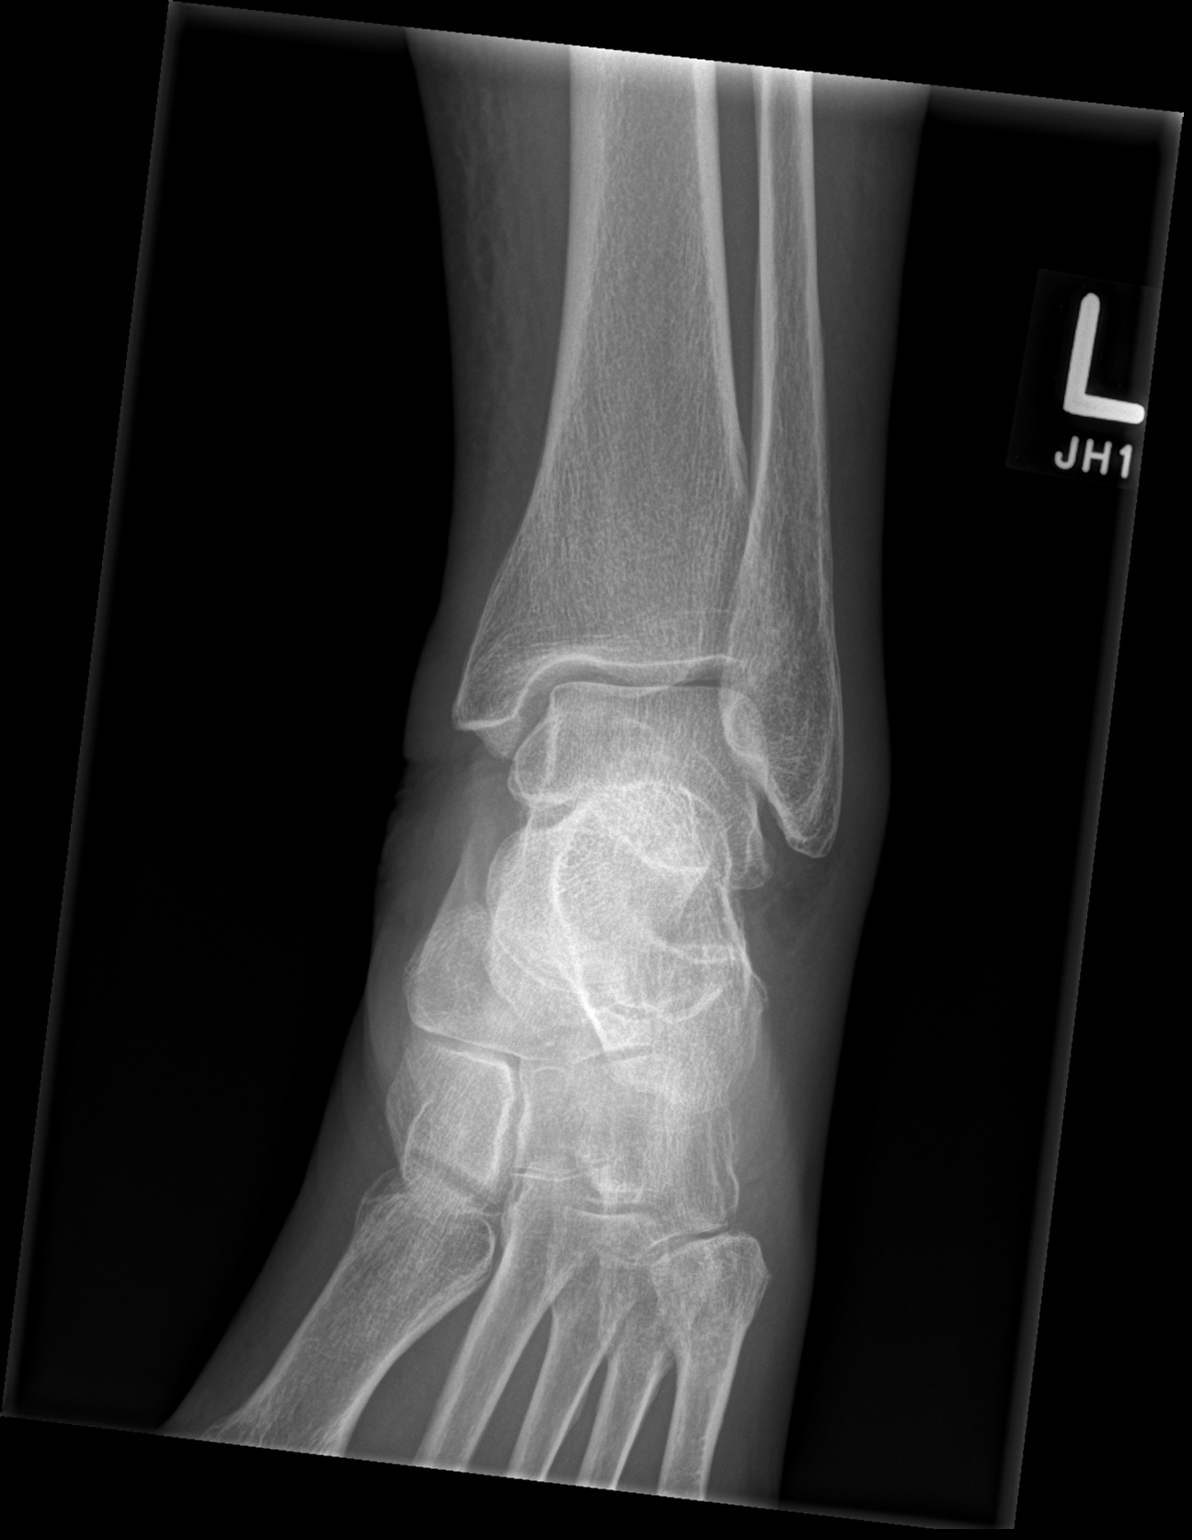

[x ankle obl left]
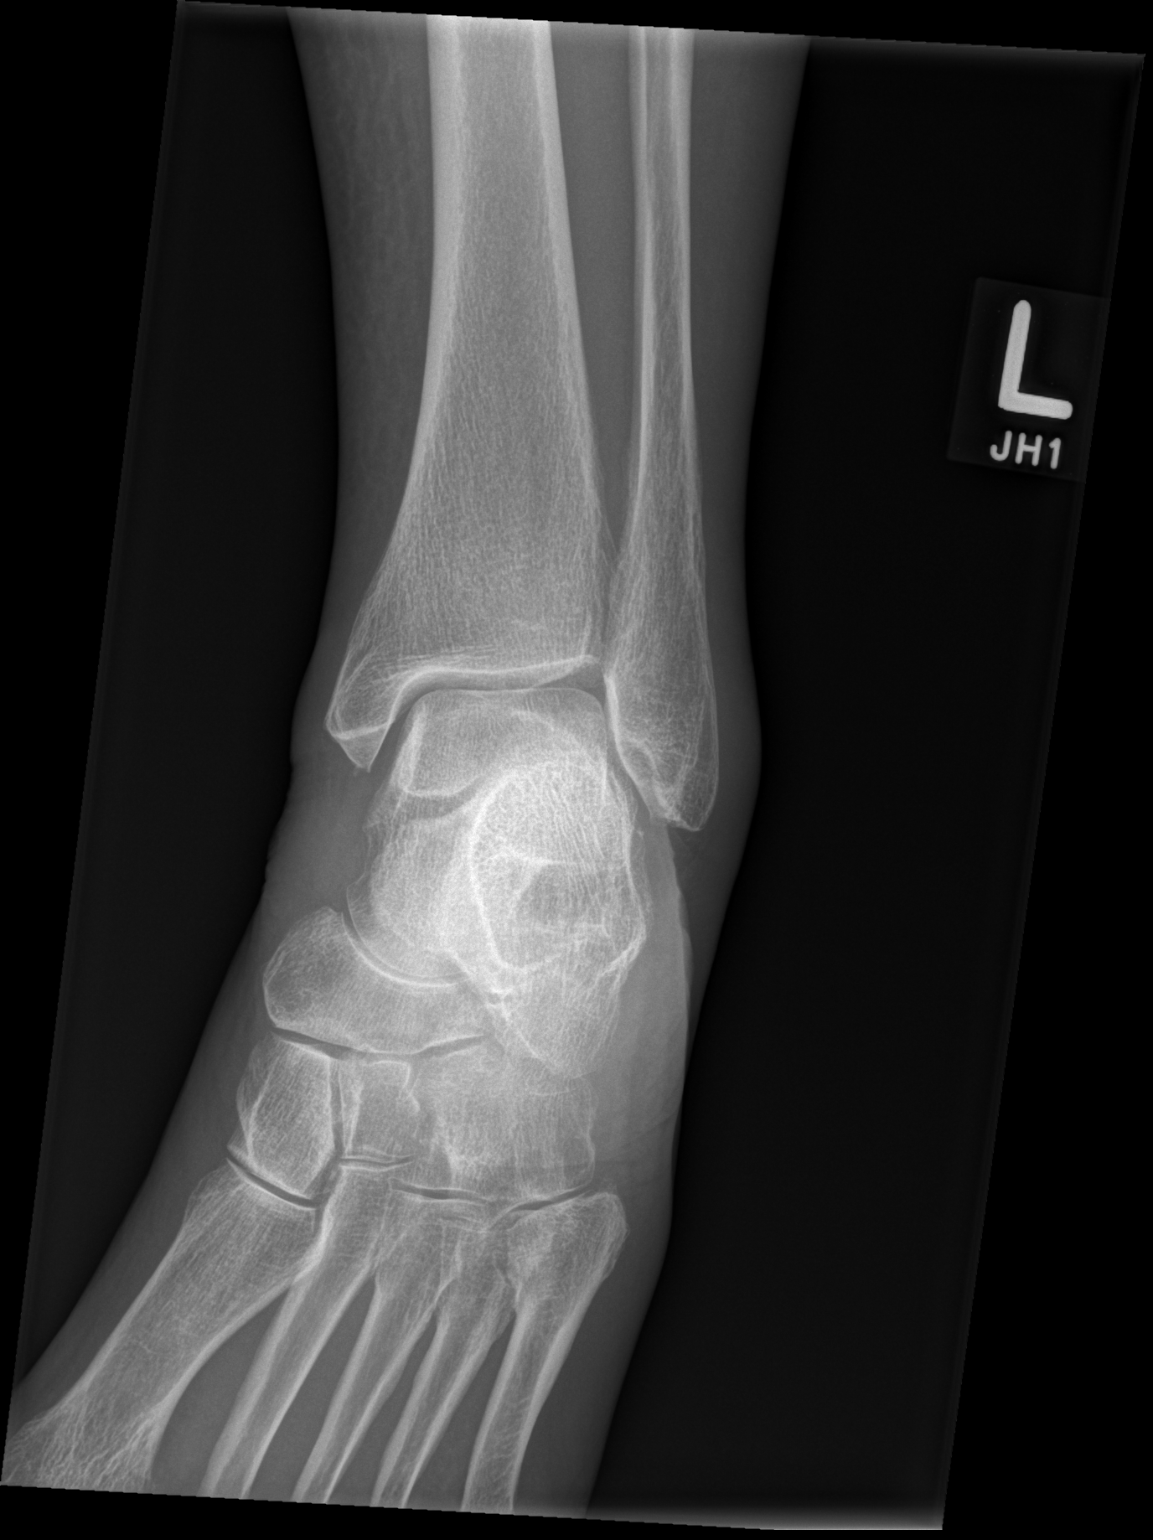

[x ankle lat left]
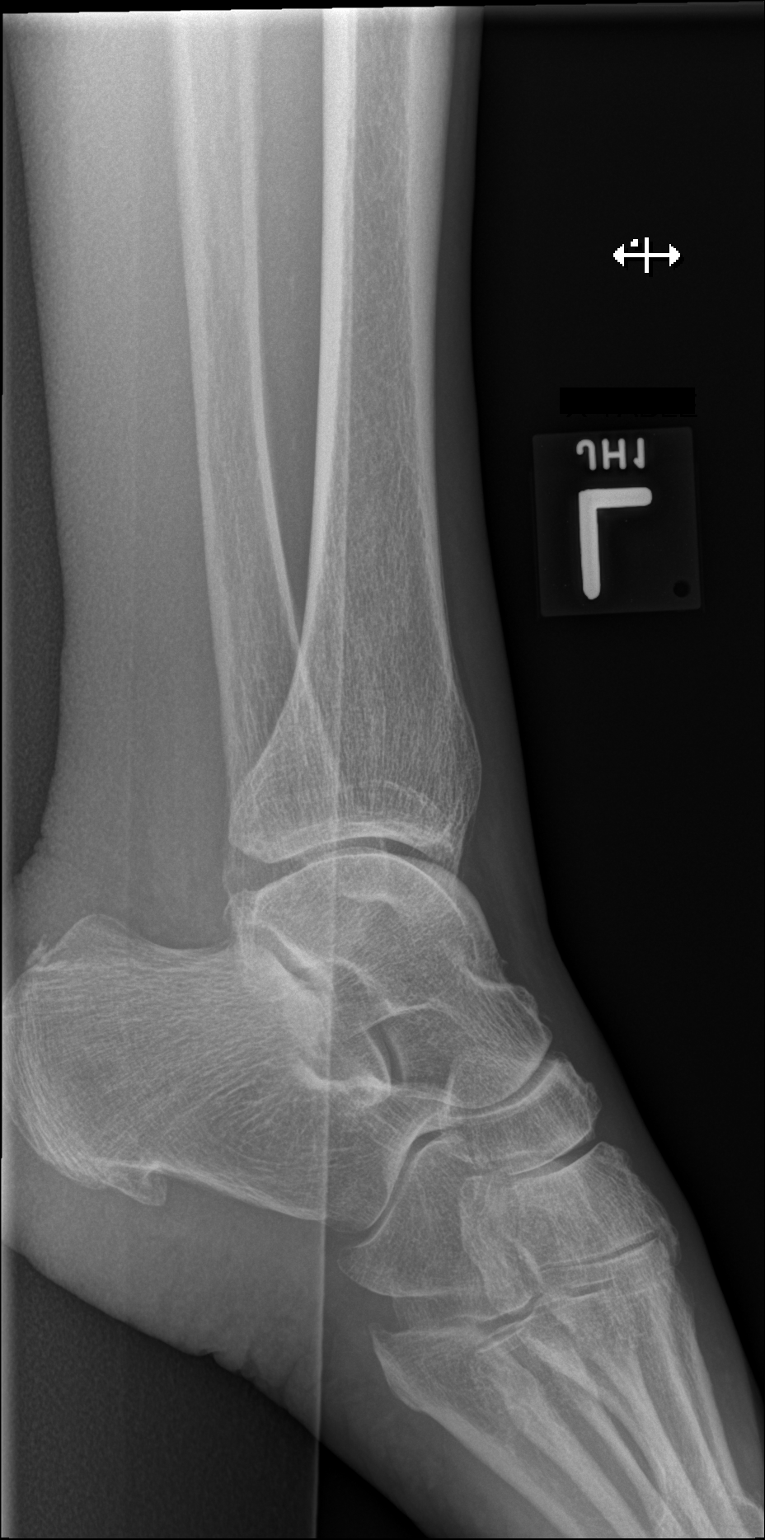

[3 of 3 positions shown; findings below may reference images not displayed]

FINDINGS: There is no evidence of fracture, dislocation, or joint effusion.
There is no evidence of arthropathy or other focal bone abnormality.
Soft tissues are unremarkable.
IMPRESSION: Negative.

## 2021-07-31 IMAGING — CR LEFT TIBIA AND FIBULA - 2 VIEW
2 series · 2 of 2 positions shown · non-contrast
Comparison: None.

CLINICAL DATA: Fall, bruising

EXAM:
LEFT TIBIA AND FIBULA - 2 VIEW

[x tib-fib ap left]
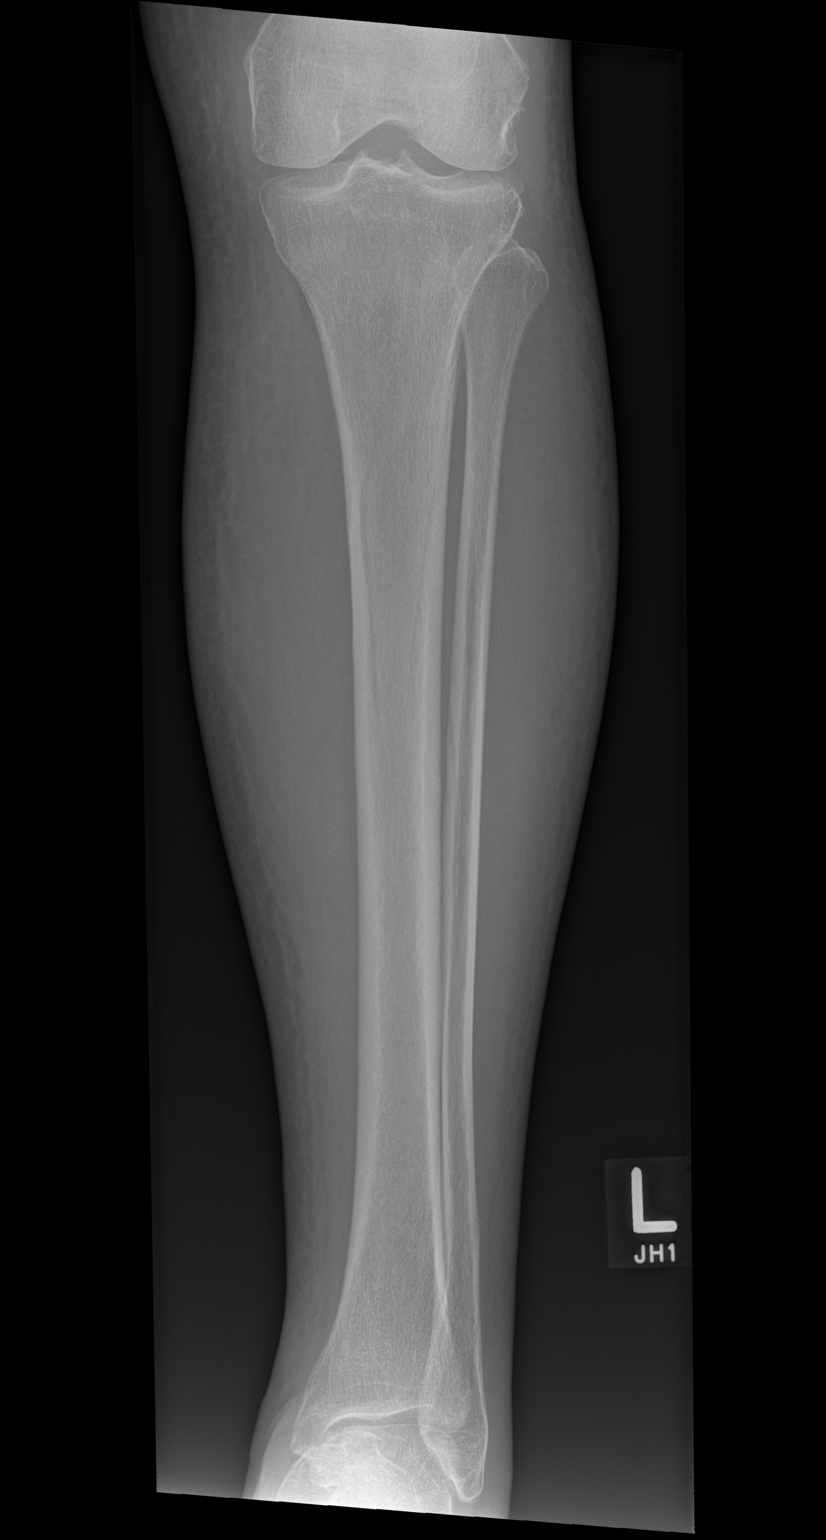

[x tib-fib lat left]
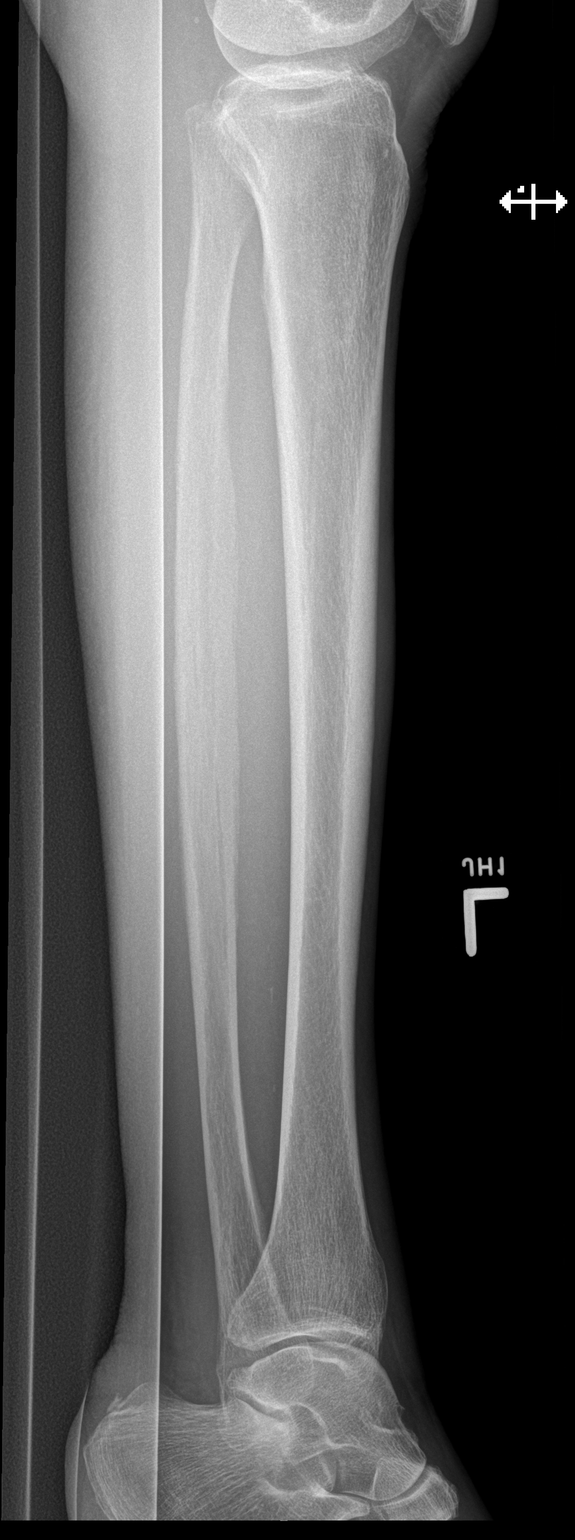

[2 of 2 positions shown; findings below may reference images not displayed]

FINDINGS: There is no evidence of fracture or other focal bone lesions. Soft
tissues are unremarkable.
IMPRESSION: Negative.
